# Patient Record
Sex: Male | Born: 1970 | Race: White | Hispanic: No | Marital: Married | Smoking: Never smoker
Health system: Southern US, Community
[De-identification: ages and names within clinical notes are randomized; demographics above are authoritative.]

## PROBLEM LIST (undated history)

## (undated) HISTORY — PX: ROTATOR CUFF REPAIR: SHX139

## (undated) HISTORY — PX: APPENDECTOMY: SHX54

## (undated) HISTORY — PX: TONSILLECTOMY: SUR1361

---

## 2009-10-08 ENCOUNTER — Emergency Department: Payer: Self-pay | Admitting: Emergency Medicine

## 2010-09-17 ENCOUNTER — Emergency Department: Payer: Self-pay | Admitting: Emergency Medicine

## 2012-09-24 ENCOUNTER — Emergency Department (HOSPITAL_BASED_OUTPATIENT_CLINIC_OR_DEPARTMENT_OTHER)
Admission: EM | Admit: 2012-09-24 | Discharge: 2012-09-24 | Disposition: A | Discharge status: Home / Self Care | Payer: Worker's Compensation | Attending: Emergency Medicine | Admitting: Emergency Medicine

## 2012-09-24 ENCOUNTER — Encounter (HOSPITAL_BASED_OUTPATIENT_CLINIC_OR_DEPARTMENT_OTHER): Payer: Self-pay | Admitting: Emergency Medicine

## 2012-09-24 ENCOUNTER — Emergency Department (HOSPITAL_BASED_OUTPATIENT_CLINIC_OR_DEPARTMENT_OTHER): Payer: Worker's Compensation

## 2012-09-24 DIAGNOSIS — W268XXA Contact with other sharp object(s), not elsewhere classified, initial encounter: Secondary | ICD-10-CM | POA: Insufficient documentation

## 2012-09-24 DIAGNOSIS — S61219A Laceration without foreign body of unspecified finger without damage to nail, initial encounter: Secondary | ICD-10-CM

## 2012-09-24 DIAGNOSIS — Y990 Civilian activity done for income or pay: Secondary | ICD-10-CM | POA: Insufficient documentation

## 2012-09-24 DIAGNOSIS — Y9389 Activity, other specified: Secondary | ICD-10-CM | POA: Insufficient documentation

## 2012-09-24 DIAGNOSIS — S61209A Unspecified open wound of unspecified finger without damage to nail, initial encounter: Secondary | ICD-10-CM | POA: Insufficient documentation

## 2012-09-24 DIAGNOSIS — Z880 Allergy status to penicillin: Secondary | ICD-10-CM | POA: Insufficient documentation

## 2012-09-24 DIAGNOSIS — Y9289 Other specified places as the place of occurrence of the external cause: Secondary | ICD-10-CM | POA: Insufficient documentation

## 2012-09-24 MED ORDER — LIDOCAINE HCL 2 % IJ SOLN
INTRAMUSCULAR | Status: AC
  Filled 2012-09-24: qty 20

## 2012-09-24 MED ORDER — TETANUS-DIPHTH-ACELL PERTUSSIS 5-2.5-18.5 LF-MCG/0.5 IM SUSP
0.5000 mL | Freq: Once | INTRAMUSCULAR | Status: AC
  Administered 2012-09-24: 0.5 mL via INTRAMUSCULAR
  Filled 2012-09-24: qty 0.5

## 2012-09-24 MED ORDER — HYDROCODONE-ACETAMINOPHEN 5-325 MG PO TABS: 1.0000 | ORAL_TABLET | ORAL | Status: DC | PRN

## 2012-09-24 NOTE — ED Notes (Signed)
Pt has laceration to left index finger from an engine part

## 2012-09-24 NOTE — ED Provider Notes (Signed)
History     CSN: [id number]  Arrival date & time 09/24/12  4098   First MD Initiated Contact with Patient 09/24/12 1913      Chief Complaint  Patient presents with  . Finger Injury    (Consider location/radiation/quality/duration/timing/severity/associated sxs/prior treatment) HPI Comments: Pt states that he cut his left index finger on a piece of equipment at work:pt states that is not having problems with movement he couldn't get it to stop bleeding:pt states that he doesn't think the tetanus is utd:pt states that the injury helped a short time ago  The history is provided by the patient. No language interpreter was used.    History reviewed. No pertinent past medical history.  Past Surgical History  Procedure Laterality Date  . Rotator cuff repair    . Appendectomy      No family history on file.  History  Substance Use Topics  . Smoking status: Never Smoker   . Smokeless tobacco: Not on file  . Alcohol Use: Yes      Review of Systems  Constitutional: Negative.   Respiratory: Negative.   Cardiovascular: Negative.     Allergies  Penicillins  Home Medications   Current Outpatient Rx  Name  Route  Sig  Dispense  Refill  . Aspirin-Salicylamide-Caffeine (BC HEADACHE POWDER PO)   Oral   Take by mouth as needed.           BP 151/83  Pulse 78  Temp(Src) 98.5 F (36.9 C) (Oral)  Resp 18  Ht 5\' 9"  (1.753 m)  Wt 265 lb (120.203 kg)  BMI 39.12 kg/m2  SpO2 98%  Physical Exam  Nursing note and vitals reviewed. Constitutional: He is oriented to person, place, and time. He appears well-developed and well-nourished.  HENT:  Head: Normocephalic and atraumatic.  Cardiovascular: Normal rate and regular rhythm.   Pulmonary/Chest: Effort normal and breath sounds normal.  Musculoskeletal: Normal range of motion.  Neurological: He is alert and oriented to person, place, and time.  Skin:  Laceration noted over the pip of the dorsal aspect of the left index  finger  Psychiatric: He has a normal mood and affect.    ED Course  LACERATION REPAIR Date/Time: 09/24/2012 8:21 PM Performed by: Teressa Lower Authorized by: Teressa Lower Consent: Verbal consent obtained. Risks and benefits: risks, benefits and alternatives were discussed Patient identity confirmed: verbally with patient Time out: Immediately prior to procedure a "time out" was called to verify the correct patient, procedure, equipment, support staff and site/side marked as required. Body area: upper extremity Location details: left index finger Laceration length: 1.5 cm Foreign bodies: no foreign bodies Anesthesia: digital block Local anesthetic: lidocaine 2% without epinephrine Irrigation solution: saline Irrigation method: syringe Amount of cleaning: extensive Skin closure: 4-0 Prolene Number of sutures: 2 Technique: simple Approximation: close Approximation difficulty: simple Patient tolerance: Patient tolerated the procedure well with no immediate complications.   (including critical care time)  Labs Reviewed - No data to display Dg Finger Index Left  09/24/2012   *RADIOLOGY REPORT*  Clinical Data: Laceration.  LEFT INDEX FINGER 2+V  Comparison: None.  Findings: No acute bony abnormality.  Specifically, no fracture, subluxation, or dislocation.  Soft tissues are intact.  No radiopaque foreign body visualized.  Joint spaces are maintained.  IMPRESSION: No bony abnormality or radiopaque foreign body.   Original Report Authenticated By: Charlett Nose, M.D.     1. Finger laceration, initial encounter       MDM  Wound closed without  any problem:no bony abnormality or fb noted       Teressa Lower, NP 09/24/12 2022

## 2012-09-24 NOTE — ED Notes (Signed)
Patient transported to X-ray 

## 2012-09-24 NOTE — ED Provider Notes (Signed)
Medical screening examination/treatment/procedure(s) were performed by non-physician practitioner and as supervising physician I was immediately available for consultation/collaboration.    Gilda Crease, MD 09/24/12 [phone]

## 2014-04-16 DIAGNOSIS — B9689 Other specified bacterial agents as the cause of diseases classified elsewhere: Secondary | ICD-10-CM | POA: Insufficient documentation

## 2014-04-16 DIAGNOSIS — J019 Acute sinusitis, unspecified: Secondary | ICD-10-CM

## 2014-08-23 ENCOUNTER — Emergency Department: Payer: 59

## 2014-08-23 ENCOUNTER — Other Ambulatory Visit: Payer: Self-pay

## 2014-08-23 ENCOUNTER — Encounter: Payer: Self-pay | Admitting: Emergency Medicine

## 2014-08-23 ENCOUNTER — Emergency Department
Admission: EM | Admit: 2014-08-23 | Discharge: 2014-08-23 | Disposition: A | Discharge status: Home / Self Care | Payer: 59 | Attending: Emergency Medicine | Admitting: Emergency Medicine

## 2014-08-23 DIAGNOSIS — R079 Chest pain, unspecified: Secondary | ICD-10-CM | POA: Insufficient documentation

## 2014-08-23 DIAGNOSIS — Z880 Allergy status to penicillin: Secondary | ICD-10-CM | POA: Diagnosis not present

## 2014-08-23 LAB — BASIC METABOLIC PANEL
Anion gap: 8 (ref 5–15)
BUN: 21 mg/dL — AB (ref 6–20)
CALCIUM: 8.9 mg/dL (ref 8.9–10.3)
CHLORIDE: 107 mmol/L (ref 101–111)
CO2: 23 mmol/L (ref 22–32)
CREATININE: 0.8 mg/dL (ref 0.61–1.24)
GFR calc Af Amer: >60 mL/min (ref >60)
Glucose, Bld: 93 mg/dL (ref 65–99)
Potassium: 4.2 mmol/L (ref 3.5–5.1)
Sodium: 138 mmol/L (ref 135–145)

## 2014-08-23 LAB — CBC
HCT: 45.1 % (ref 40.0–52.0)
Hemoglobin: 14.7 g/dL (ref 13.0–18.0)
MCH: 27.3 pg (ref 26.0–34.0)
MCHC: 32.6 g/dL (ref 32.0–36.0)
MCV: 83.9 fL (ref 80.0–100.0)
Platelets: 179 10*3/uL (ref 150–440)
RBC: 5.38 MIL/uL (ref 4.40–5.90)
RDW: 14.7 % — ABNORMAL HIGH (ref 11.5–14.5)
WBC: 5.9 10*3/uL (ref 3.8–10.6)

## 2014-08-23 LAB — TROPONIN I: Troponin I: <0.03 ng/mL (ref <0.031)

## 2014-08-23 MED ORDER — NITROGLYCERIN 0.4 MG SL SUBL
SUBLINGUAL_TABLET | SUBLINGUAL | Status: AC
  Administered 2014-08-23: 0.4 mg
  Filled 2014-08-23: qty 1

## 2014-08-23 MED ORDER — ASPIRIN 81 MG PO CHEW
CHEWABLE_TABLET | ORAL | Status: AC
  Administered 2014-08-23: 324 mg
  Filled 2014-08-23: qty 4

## 2014-08-23 NOTE — ED Notes (Signed)
Patient presents to ED with complaints of right sided chest pain of acute onset ~4:15 this morning. Reports radiation right lateral. Denies nausea, vomiting or shortness of breath. Reports similar episode 10 days ago; reports resolved spontaneously. Patient in no acute distress. Patient hypertensive during triage; denies hx and does not take BP meds.

## 2014-08-23 NOTE — ED Provider Notes (Signed)
[facility] Emergency Department Provider Note  ____________________________________________  Time seen: 7:30 AM  I have reviewed the triage vital signs and the nursing notes.   HISTORY  Chief Complaint Chest Pain      HPI Whitman HeroChristopher Rachal is a 44 y.o. male chest pressure, which started at approximately 4:30 AM this morning while sleeping. He notes he had an episode like this about 2 weeks ago which resolved on its own. He is not describing radiation to me. The pain is moderate 4 out of 10. He is not short of breath. He reports he has not had a full checkup by a physician ever. He does  have high blood pressure. He also has a strong family history of heart disease but he reports he has never had heart attack. He does not smoke     History reviewed. No pertinent past medical history.  There are no active problems to display for this patient.   Past Surgical History  Procedure Laterality Date  . Rotator cuff repair    . Appendectomy      Current Outpatient Rx  Name  Route  Sig  Dispense  Refill  . Aspirin-Salicylamide-Caffeine (BC HEADACHE POWDER PO)   Oral   Take by mouth as needed.         Marland Kitchen. HYDROcodone-acetaminophen (NORCO/VICODIN) 5-325 MG per tablet   Oral   Take 1 tablet by mouth every 4 (four) hours as needed for pain.   6 tablet   0     Allergies Penicillins  Family History  Problem Relation Age of Onset  . Diabetes Mother   . Heart failure Father   . Diabetes Father     Social History History  Substance Use Topics  . Smoking status: Never Smoker   . Smokeless tobacco: Current User    Types: Chew  . Alcohol Use: Yes     Comment: Social drinker    Review of Systems  Constitutional: Negative for fever. Eyes: Negative for visual changes. ENT: Negative for sore throat. Cardiovascular: Positive for chest pain Respiratory: Negative for shortness of breath. Gastrointestinal: Negative for abdominal pain, vomiting and  diarrhea. Genitourinary: Negative for dysuria. Musculoskeletal: Negative for back pain. Skin: Negative for rash. Neurological: Negative for headaches, focal weakness or numbness. Psychiatric: No anxiety  10-point ROS otherwise negative.  ____________________________________________   PHYSICAL EXAM:  VITAL SIGNS: ED Triage Vitals  Enc Vitals Group     BP 08/23/14 0716 167/101 mmHg     Pulse Rate 08/23/14 0716 67     Resp --      Temp 08/23/14 0716 97.7 F (36.5 C)     Temp Source 08/23/14 0716 Oral     SpO2 08/23/14 0716 98 %     Weight 08/23/14 0716 301 lb 6.4 oz (136.714 kg)     Height 08/23/14 0716 5\' 9"  (1.753 m)     Head Cir --      Peak Flow --      Pain Score 08/23/14 0718 5     Pain Loc --      Pain Edu? --      Excl. in GC? --      Constitutional: Alert and oriented. Well appearing and in no distress. Eyes: Conjunctivae are normal. . Cardiovascular: Normal rate, regular rhythm. Normal and symmetric distal pulses are present in all extremities. No murmurs, rubs, or gallops. Respiratory: Normal respiratory effort without tachypnea nor retractions. Breath sounds are clear and equal bilaterally. No wheezes/rales/rhonchi. Gastrointestinal: Soft  and nontender. No distention. There is no CVA tenderness. Genitourinary: deferred Musculoskeletal: Nontender with normal range of motion in all extremities. No joint effusions.  No lower extremity tenderness nor edema. Neurologic:  Normal speech and language. No gross focal neurologic deficits are appreciated. Speech is normal.  Skin:  Skin is warm, dry and intact. No rash noted. Psychiatric: Mood and affect are normal. Speech and behavior are normal. Patient exhibits appropriate insight and judgment.  ____________________________________________    LABS (pertinent positives/negatives)  Troponin negative 2. Otherwise normal labs  ____________________________________________   EKG   Date: 08/23/2014  Rate: 60   Rhythm: normal sinus rhythm  QRS Axis: normal  Intervals: normal  ST/T Wave abnormalities: normal  Conduction Disutrbances: none  Narrative Interpretation: unremarkable      ____________________________________________    RADIOLOGY  Normal chest x-ray  ____________________________________________   PROCEDURES  Procedure(s) performed:None  Critical Care performed: None  ____________________________________________   INITIAL IMPRESSION / ASSESSMENT AND PLAN / ED COURSE  Pertinent labs & imaging results that were available during my care of the patient were reviewed by me and considered in my medical decision making (see chart for details).  Patient well-appearing, initial EKG is reassuring however we are waiting for blood work ____________________________________________ ----------------------------------------- 11:15 AM on 08/23/2014 -----------------------------------------  Patient chest pain-free. Positive negative 2. EKG reassuring. We will have patient follow-up with cardiology within 48 hours. He agrees.  FINAL CLINICAL IMPRESSION(S) / ED DIAGNOSES  Final diagnoses:  Chest pain, unspecified chest pain type     Jene Everyobert Wadie Mattie, MD 08/23/14 [phone]

## 2014-08-23 NOTE — ED Notes (Signed)
Pt in room with significant other at bedside. Vitals updated.

## 2014-08-23 NOTE — ED Notes (Signed)
Pt informed to return if life threatening symptoms occur.   

## 2014-08-23 NOTE — Discharge Instructions (Signed)

## 2014-08-27 ENCOUNTER — Emergency Department: Payer: 59

## 2014-08-27 ENCOUNTER — Encounter: Payer: Self-pay | Admitting: Emergency Medicine

## 2014-08-27 ENCOUNTER — Emergency Department
Admission: EM | Admit: 2014-08-27 | Discharge: 2014-08-28 | Disposition: A | Discharge status: Home / Self Care | Payer: 59 | Attending: Emergency Medicine | Admitting: Emergency Medicine

## 2014-08-27 DIAGNOSIS — K5732 Diverticulitis of large intestine without perforation or abscess without bleeding: Secondary | ICD-10-CM | POA: Insufficient documentation

## 2014-08-27 DIAGNOSIS — Z880 Allergy status to penicillin: Secondary | ICD-10-CM | POA: Diagnosis not present

## 2014-08-27 DIAGNOSIS — R1031 Right lower quadrant pain: Secondary | ICD-10-CM

## 2014-08-27 DIAGNOSIS — Z7982 Long term (current) use of aspirin: Secondary | ICD-10-CM | POA: Diagnosis not present

## 2014-08-27 LAB — COMPREHENSIVE METABOLIC PANEL
ALT: 21 U/L (ref 17–63)
ANION GAP: 11 (ref 5–15)
AST: 21 U/L (ref 15–41)
Albumin: 4.1 g/dL (ref 3.5–5.0)
Alkaline Phosphatase: 63 U/L (ref 38–126)
BILIRUBIN TOTAL: 0.3 mg/dL (ref 0.3–1.2)
BUN: 26 mg/dL — ABNORMAL HIGH (ref 6–20)
CO2: 21 mmol/L — AB (ref 22–32)
Calcium: 8.9 mg/dL (ref 8.9–10.3)
Chloride: 106 mmol/L (ref 101–111)
Creatinine, Ser: 1.58 mg/dL — ABNORMAL HIGH (ref 0.61–1.24)
GFR calc Af Amer: 59 mL/min — ABNORMAL LOW (ref >60)
GFR calc non Af Amer: 51 mL/min — ABNORMAL LOW (ref >60)
GLUCOSE: 85 mg/dL (ref 65–99)
Potassium: 3.8 mmol/L (ref 3.5–5.1)
SODIUM: 138 mmol/L (ref 135–145)
Total Protein: 7.7 g/dL (ref 6.5–8.1)

## 2014-08-27 LAB — URINALYSIS COMPLETE WITH MICROSCOPIC (ARMC ONLY)
BILIRUBIN URINE: NEGATIVE
Bacteria, UA: NONE SEEN
Glucose, UA: NEGATIVE mg/dL
HGB URINE DIPSTICK: NEGATIVE
Leukocytes, UA: NEGATIVE
Nitrite: NEGATIVE
PH: 5 (ref 5.0–8.0)
Protein, ur: 100 mg/dL — AB
SQUAMOUS EPITHELIAL / LPF: NONE SEEN
Specific Gravity, Urine: 1.028 (ref 1.005–1.030)

## 2014-08-27 LAB — CBC WITH DIFFERENTIAL/PLATELET
BASOS ABS: 0.1 10*3/uL (ref 0–0.1)
BASOS PCT: 1 %
EOS PCT: 1 %
Eosinophils Absolute: 0.1 10*3/uL (ref 0–0.7)
HEMATOCRIT: 40.2 % (ref 40.0–52.0)
HEMOGLOBIN: 13.1 g/dL (ref 13.0–18.0)
Lymphocytes Relative: 17 %
Lymphs Abs: 2 10*3/uL (ref 1.0–3.6)
MCH: 27.2 pg (ref 26.0–34.0)
MCHC: 32.6 g/dL (ref 32.0–36.0)
MCV: 83.5 fL (ref 80.0–100.0)
MONO ABS: 1 10*3/uL (ref 0.2–1.0)
MONOS PCT: 9 %
Neutro Abs: 8.3 10*3/uL — ABNORMAL HIGH (ref 1.4–6.5)
Neutrophils Relative %: 72 %
Platelets: 173 10*3/uL (ref 150–440)
RBC: 4.81 MIL/uL (ref 4.40–5.90)
RDW: 14.6 % — ABNORMAL HIGH (ref 11.5–14.5)
WBC: 11.4 10*3/uL — ABNORMAL HIGH (ref 3.8–10.6)

## 2014-08-27 MED ORDER — IOHEXOL 300 MG/ML  SOLN
100.0000 mL | Freq: Once | INTRAMUSCULAR | Status: AC | PRN
  Administered 2014-08-27: 100 mL via INTRAVENOUS

## 2014-08-27 MED ORDER — ONDANSETRON HCL 4 MG/2ML IJ SOLN: 4.0000 mg | Freq: Once | INTRAMUSCULAR | Status: AC

## 2014-08-27 MED ORDER — MORPHINE SULFATE 4 MG/ML IJ SOLN
INTRAMUSCULAR | Status: AC
  Administered 2014-08-27: 4 mg via INTRAVENOUS
  Filled 2014-08-27: qty 1

## 2014-08-27 MED ORDER — MORPHINE SULFATE 4 MG/ML IJ SOLN: 4.0000 mg | Freq: Once | INTRAMUSCULAR | Status: AC

## 2014-08-27 MED ORDER — IOHEXOL 240 MG/ML SOLN
25.0000 mL | Freq: Once | INTRAMUSCULAR | Status: AC | PRN
  Administered 2014-08-27: 25 mL via ORAL

## 2014-08-27 MED ORDER — ONDANSETRON HCL 4 MG/2ML IJ SOLN
INTRAMUSCULAR | Status: AC
  Administered 2014-08-27: 4 mg via INTRAVENOUS
  Filled 2014-08-27: qty 2

## 2014-08-27 MED ORDER — SODIUM CHLORIDE 0.9 % IV BOLUS (SEPSIS)
1000.0000 mL | Freq: Once | INTRAVENOUS | Status: AC
  Administered 2014-08-27: 1000 mL via INTRAVENOUS

## 2014-08-27 NOTE — ED Notes (Signed)
Pt reports some issues with urination; pt reports "having the feeling of a full bladder, but the amount of urine produced is not consistent the the amount he feels is in his bladder".

## 2014-08-27 NOTE — ED Notes (Signed)
Pt reports abdominal pain and groin pain/swelling since Wednesday. Pt reports he thought "he might have given himself a hernia when lifting a toilet by himself". Pt reports no pain immediately after lifting, but was extremely sore the following day. Pt reports being seen Monday night in the ED for chest pain but is unsure if the symptoms/conditions are related. Pt is A&O, in NAD, mildly uncomfortable d/t pain/discomfort.

## 2014-08-27 NOTE — ED Provider Notes (Signed)
[facility] Emergency Department Provider Note  Time seen: 9:14 PM  I have reviewed the triage vital signs and the nursing notes.   HISTORY  Chief Complaint Abdominal Pain    HPI Lawrence Mercer is a 44 y.o. male presents the emergency department right lower quadrant abdominal pain. According to the patient he was doing some heavy lifting approximately 3 days ago. Patient denies any pain at that time. The next morning he awoke with right lower quadrant pain which has worsened over the last 2 days. Patient states subjective fevers at home but has not measured a temperature. Some nausea, denies vomiting, denies diarrhea or dysuria. Patient had a normal bowel movement today.Patient states he had an appendectomy approximately 20 years ago. Describes abdominal pain as right lower quadrant, moderate in severity worse with movement.     History reviewed. No pertinent past medical history.  There are no active problems to display for this patient.   Past Surgical History  Procedure Laterality Date  . Rotator cuff repair    . Appendectomy      Current Outpatient Rx  Name  Route  Sig  Dispense  Refill  . Aspirin-Salicylamide-Caffeine (BC HEADACHE POWDER PO)   Oral   Take by mouth as needed.         Marland Kitchen. HYDROcodone-acetaminophen (NORCO/VICODIN) 5-325 MG per tablet   Oral   Take 1 tablet by mouth every 4 (four) hours as needed for pain.   6 tablet   0     Allergies Penicillins  Family History  Problem Relation Age of Onset  . Diabetes Mother   . Heart failure Father   . Diabetes Father     Social History History  Substance Use Topics  . Smoking status: Never Smoker   . Smokeless tobacco: Current User    Types: Chew  . Alcohol Use: Yes     Comment: Social drinker    Review of Systems Constitutional: Positive for subjective fevers Cardiovascular: Negative for chest pain. Respiratory: Negative for shortness of breath. Gastrointestinal:  Positive for right lower quadrant abdominal pain, and nausea. Negative for vomiting or diarrhea. Genitourinary: Negative for dysuria. 10-point ROS otherwise negative.  ____________________________________________   PHYSICAL EXAM:  VITAL SIGNS: ED Triage Vitals  Enc Vitals Group     BP 08/27/14 1706 141/87 mmHg     Pulse Rate 08/27/14 1706 71     Resp 08/27/14 1706 20     Temp 08/27/14 1706 98.8 F (37.1 C)     Temp Source 08/27/14 1706 Oral     SpO2 08/27/14 1706 97 %     Weight 08/27/14 1706 295 lb (133.811 kg)     Height 08/27/14 1706 5\' 9"  (1.753 m)     Head Cir --      Peak Flow --      Pain Score 08/27/14 1707 6     Pain Loc --      Pain Edu? --      Excl. in GC? --     Constitutional: Alert and oriented. Well appearing and in no distress. ENT   Mouth/Throat: Mucous membranes are moist. Cardiovascular: Normal rate, regular rhythm. No murmurs Respiratory: Normal respiratory effort without tachypnea nor retractions. Breath sounds are clear and equal bilaterally. No wheezes/rales/rhonchi. Gastrointestinal: Soft, moderate right lower quadrant abdominal tenderness palpation without rebound or guarding. No hernia mass palpated on GU exam, testicles largely nontender to palpation. Musculoskeletal: Nontender with normal range of motion in all extremities.  Neurologic:  Normal speech and language. No gross focal neurologic deficits Skin:  Skin is warm, dry and intact.  Psychiatric: Mood and affect are normal. Speech and behavior are normal.  ____________________________________________      RADIOLOGY  CT abdomen/pelvis pending.  ____________________________________________   INITIAL IMPRESSION / ASSESSMENT AND PLAN / ED COURSE  Pertinent labs & imaging results that were available during my care of the patient were reviewed by me and considered in my medical decision making (see chart for details).  44 year old male with right lower quadrant pain 2-3 days  gradually worsening, with subjective fevers at home. Patient is moderately tender over the right lower quadrant. Patient had an appendectomy 20 years ago however cannot rule out stump appendicitis as patient has mild white blood cell count elevation along with moderate right lower quadrant tenderness palpation. We will proceed with a CT scan to evaluate for stump appendicitis, colitis, or other intra-abdominal pathology. I discussed this with the patient is agreeable to this plan.  ----------------------------------------- 9:21 PM on 08/27/2014 -----------------------------------------  CT abdomen/pelvis pending, pain controlled with morphine, patient care signed out to Dr. Carollee MassedKaminski. ____________________________________________   FINAL CLINICAL IMPRESSION(S) / ED DIAGNOSES  Right lower quadrant abdominal pain   Minna AntisKevin Petrona Wyeth, MD 08/27/14 2121

## 2014-08-27 NOTE — ED Notes (Signed)
Patient to ER for c/o RLQ abdominal pain. Denies any history of similar pain. Reports pain into right testicle as well with "pressure while urinating".

## 2014-08-28 MED ORDER — CIPROFLOXACIN HCL 500 MG PO TABS
ORAL_TABLET | ORAL | Status: AC
  Filled 2014-08-28: qty 1

## 2014-08-28 MED ORDER — OXYCODONE-ACETAMINOPHEN 5-325 MG PO TABS: 1.0000 | ORAL_TABLET | Freq: Four times a day (QID) | ORAL | Status: AC | PRN

## 2014-08-28 MED ORDER — METRONIDAZOLE 500 MG PO TABS
500.0000 mg | ORAL_TABLET | Freq: Once | ORAL | Status: AC
  Administered 2014-08-28: 500 mg via ORAL

## 2014-08-28 MED ORDER — METRONIDAZOLE 500 MG PO TABS: 500.0000 mg | ORAL_TABLET | Freq: Three times a day (TID) | ORAL | Status: AC

## 2014-08-28 MED ORDER — CIPROFLOXACIN HCL 500 MG PO TABS: 500.0000 mg | ORAL_TABLET | Freq: Two times a day (BID) | ORAL | Status: AC

## 2014-08-28 MED ORDER — METRONIDAZOLE 500 MG PO TABS
ORAL_TABLET | ORAL | Status: AC
  Filled 2014-08-28: qty 1

## 2014-08-28 MED ORDER — CIPROFLOXACIN HCL 500 MG PO TABS
500.0000 mg | ORAL_TABLET | Freq: Once | ORAL | Status: AC
  Administered 2014-08-28: 500 mg via ORAL

## 2014-08-28 NOTE — ED Provider Notes (Signed)
Patient seen and checked out by Dr. Lenard LancePaduchowski, patient comfortable lying in bed in no acute distress at this time. CT scan reveals diverticulitis without perforation or rupture. He is given dose of Cipro and Flagyl be stable for outpatient follow-up.  Impression:  Diverticulitis   Plan: Patient continues Cipro and Flagyl, Percocet as needed. Follow-up in the ER in 2-3 days if not improving. He does have a primary care doctor, so advised to return here if his symptoms aren't any better in a few days  Emily FilbertJonathan E Mikhail Hallenbeck, MD 08/28/14 [phone]

## 2014-08-28 NOTE — Discharge Instructions (Signed)

## 2015-04-23 ENCOUNTER — Encounter: Payer: Self-pay | Admitting: Medical Oncology

## 2015-04-23 ENCOUNTER — Emergency Department
Admission: EM | Admit: 2015-04-23 | Discharge: 2015-04-23 | Disposition: A | Discharge status: Home / Self Care | Payer: 59 | Attending: Emergency Medicine | Admitting: Emergency Medicine

## 2015-04-23 ENCOUNTER — Emergency Department: Payer: 59

## 2015-04-23 DIAGNOSIS — K8020 Calculus of gallbladder without cholecystitis without obstruction: Secondary | ICD-10-CM | POA: Diagnosis not present

## 2015-04-23 DIAGNOSIS — Z880 Allergy status to penicillin: Secondary | ICD-10-CM | POA: Diagnosis not present

## 2015-04-23 DIAGNOSIS — K8050 Calculus of bile duct without cholangitis or cholecystitis without obstruction: Secondary | ICD-10-CM

## 2015-04-23 DIAGNOSIS — R1011 Right upper quadrant pain: Secondary | ICD-10-CM | POA: Diagnosis present

## 2015-04-23 LAB — URINALYSIS COMPLETE WITH MICROSCOPIC (ARMC ONLY)
BILIRUBIN URINE: NEGATIVE
Bacteria, UA: NONE SEEN
Glucose, UA: NEGATIVE mg/dL
Hgb urine dipstick: NEGATIVE
Leukocytes, UA: NEGATIVE
Nitrite: NEGATIVE
Protein, ur: 30 mg/dL — AB
RBC / HPF: NONE SEEN RBC/hpf (ref 0–5)
Specific Gravity, Urine: 1.017 (ref 1.005–1.030)
pH: 5 (ref 5.0–8.0)

## 2015-04-23 LAB — COMPREHENSIVE METABOLIC PANEL
ALT: 38 U/L (ref 17–63)
AST: 32 U/L (ref 15–41)
Albumin: 4.4 g/dL (ref 3.5–5.0)
Alkaline Phosphatase: 86 U/L (ref 38–126)
Anion gap: 8 (ref 5–15)
BUN: 16 mg/dL (ref 6–20)
CO2: 25 mmol/L (ref 22–32)
Calcium: 9.8 mg/dL (ref 8.9–10.3)
Chloride: 104 mmol/L (ref 101–111)
Creatinine, Ser: 0.73 mg/dL (ref 0.61–1.24)
GFR calc Af Amer: >60 mL/min (ref >60)
GFR calc non Af Amer: >60 mL/min (ref >60)
Glucose, Bld: 99 mg/dL (ref 65–99)
Potassium: 3.8 mmol/L (ref 3.5–5.1)
Sodium: 137 mmol/L (ref 135–145)
Total Bilirubin: 1.1 mg/dL (ref 0.3–1.2)
Total Protein: 7.9 g/dL (ref 6.5–8.1)

## 2015-04-23 LAB — CBC
HCT: 46 % (ref 40.0–52.0)
Hemoglobin: 15.2 g/dL (ref 13.0–18.0)
MCH: 26.3 pg (ref 26.0–34.0)
MCHC: 33.1 g/dL (ref 32.0–36.0)
MCV: 79.4 fL — ABNORMAL LOW (ref 80.0–100.0)
PLATELETS: 195 10*3/uL (ref 150–440)
RBC: 5.79 MIL/uL (ref 4.40–5.90)
RDW: 14.3 % (ref 11.5–14.5)
WBC: 7.5 10*3/uL (ref 3.8–10.6)

## 2015-04-23 LAB — LIPASE, BLOOD: Lipase: 28 U/L (ref 11–51)

## 2015-04-23 MED ORDER — FAMOTIDINE 20 MG PO TABS: 20.0000 mg | ORAL_TABLET | Freq: Every day | ORAL | Status: DC

## 2015-04-23 MED ORDER — OXYCODONE-ACETAMINOPHEN 5-325 MG PO TABS: 2.0000 | ORAL_TABLET | Freq: Four times a day (QID) | ORAL | Status: DC | PRN

## 2015-04-23 NOTE — ED Notes (Signed)
Pt reports rt upper quad abd pain that began last night, pt reports nausea and vomiting also.

## 2015-04-23 NOTE — ED Provider Notes (Signed)
[facility] Emergency Department Provider Note     Time seen: ----------------------------------------- 2:48 PM on 04/23/2015 -----------------------------------------    I have reviewed the triage vital signs and the nursing notes.   HISTORY  Chief Complaint Abdominal Pain    HPI Lawrence Mercer is a 45 y.o. male reports right upper quadrant abdominal pain that began last night. He reports nausea vomiting also. Standing helps his symptoms, sitting and laying back makes it worse. He's never had this happen before, denies any fevers chills or other complaints.   History reviewed. No pertinent past medical history.  There are no active problems to display for this patient.   Past Surgical History  Procedure Laterality Date  . Rotator cuff repair    . Appendectomy      Allergies Penicillins  Social History Social History  Substance Use Topics  . Smoking status: Never Smoker   . Smokeless tobacco: Current User    Types: Chew  . Alcohol Use: Yes     Comment: Social drinker    Review of Systems Constitutional: Negative for fever. Eyes: Negative for visual changes. ENT: Negative for sore throat. Cardiovascular: Negative for chest pain. Respiratory: Negative for shortness of breath. Gastrointestinal: Positive for abdominal pain and vomiting Genitourinary: Negative for dysuria. Musculoskeletal: Negative for back pain. Skin: Negative for rash. Neurological: Negative for headaches, focal weakness or numbness.  10-point ROS otherwise negative.  ____________________________________________   PHYSICAL EXAM:  VITAL SIGNS: ED Triage Vitals  Enc Vitals Group     BP 04/23/15 1245 145/96 mmHg     Pulse Rate 04/23/15 1245 99     Resp 04/23/15 1245 19     Temp 04/23/15 1245 97.8 F (36.6 C)     Temp Source 04/23/15 1245 Oral     SpO2 04/23/15 1245 98 %     Weight 04/23/15 1245 280 lb (127.007 kg)     Height 04/23/15 1245 5\' 9"  (1.753  m)     Head Cir --      Peak Flow --      Pain Score 04/23/15 1246 10     Pain Loc --      Pain Edu? --      Excl. in GC? --     Constitutional: Alert and oriented. Well appearing and in no distress. Eyes: Conjunctivae are normal. PERRL. Normal extraocular movements. ENT   Head: Normocephalic and atraumatic.   Nose: No congestion/rhinnorhea.   Mouth/Throat: Mucous membranes are moist.   Neck: No stridor. Cardiovascular: Normal rate, regular rhythm. Normal and symmetric distal pulses are present in all extremities. No murmurs, rubs, or gallops. Respiratory: Normal respiratory effort without tachypnea nor retractions. Breath sounds are clear and equal bilaterally. No wheezes/rales/rhonchi. Gastrointestinal: Right upper quadrant pain, no rebound or guarding. Normal bowel sounds. Musculoskeletal: Nontender with normal range of motion in all extremities. No joint effusions.  No lower extremity tenderness nor edema. Neurologic:  Normal speech and language. No gross focal neurologic deficits are appreciated. Speech is normal. No gait instability. Skin:  Skin is warm, dry and intact. No rash noted. Psychiatric: Mood and affect are normal. Speech and behavior are normal. Patient exhibits appropriate insight and judgment. ____________________________________________  EKG: Interpreted by me. Normal sinus rhythm with a rate of 95 bpm, normal PR interval, normal QRS, normal QT interval. Normal EKG  ____________________________________________  ED COURSE:  Pertinent labs & imaging results that were available during my care of the patient were reviewed by me and considered in my medical  decision making (see chart for details). Patient is no acute distress, will check basic labs a quadrant ultrasound ____________________________________________    LABS (pertinent positives/negatives)  Labs Reviewed  CBC - Abnormal; Notable for the following:    MCV 79.4 (*)    All other  components within normal limits  URINALYSIS COMPLETEWITH MICROSCOPIC ([facility] ONLY) - Abnormal; Notable for the following:    Color, Urine YELLOW (*)    APPearance CLEAR (*)    Ketones, ur TRACE (*)    Protein, ur 30 (*)    Squamous Epithelial / LPF 0-5 (*)    All other components within normal limits  LIPASE, BLOOD  COMPREHENSIVE METABOLIC PANEL    RADIOLOGY Images were viewed by me  Right upper quadrant ultrasound IMPRESSION: Mild gallbladder sludge. No findings to suggest acute cholecystitis.  Mild diffuse hepatic steatosis. ____________________________________________  FINAL ASSESSMENT AND PLAN  Abdominal pain, biliary colic  Plan: Patient with labs and imaging as dictated above. Patient with either a poorly functioning gallbladder or biliary colic. He does have gallbladder sludge and is tender in the right upper quadrant only. His labs are otherwise normal. He'll be referred to general surgery for follow-up as an outpatient.   Emily FilbertWilliams, Jonathan E, MD   Emily FilbertJonathan E Williams, MD 04/23/15 [phone]

## 2015-04-23 NOTE — Discharge Instructions (Signed)
Biliary Colic °Biliary colic is a pain in the upper abdomen. The pain: °· Is usually felt on the right side of the abdomen, but it may also be felt in the center of the abdomen, just below the breastbone (sternum). °· May spread back toward the right shoulder blade. °· May be steady or irregular. °· May be accompanied by nausea and vomiting. °Most of the time, the pain goes away in 1-5 hours. After the most intense pain passes, the abdomen may continue to ache mildly for about 24 hours. °Biliary colic is caused by a blockage in the bile duct. The bile duct is a pathway that carries bile--a liquid that helps to digest fats--from the gallbladder to the small intestine. Biliary colic usually occurs after eating, when the digestive system demands bile. The pain develops when muscle cells contract forcefully to try to move the blockage so that bile can get by. °HOME CARE INSTRUCTIONS °· Take medicines only as directed by your health care provider. °· Drink enough fluid to keep your urine clear or pale yellow. °· Avoid fatty, greasy, and fried foods. These kinds of foods increase your body's demand for bile. °· Avoid any foods that make your pain worse. °· Avoid overeating. °· Avoid having a large meal after fasting. °SEEK MEDICAL CARE IF: °· You develop a fever. °· Your pain gets worse. °· You vomit. °· You develop nausea that prevents you from eating and drinking. °SEEK IMMEDIATE MEDICAL CARE IF: °· You suddenly develop a fever and shaking chills. °· You develop a yellowish discoloration (jaundice) of: °¨ Skin. °¨ Whites of the eyes. °¨ Mucous membranes. °· You have continuous or severe pain that is not relieved with medicines. °· You have nausea and vomiting that is not relieved with medicines. °· You develop dizziness or you faint. °  °This information is not intended to replace advice given to you by your health care provider. Make sure you discuss any questions you have with your health care provider. °  °Document  Released: 08/27/2005 Document Revised: 08/10/2014 Document Reviewed: 01/05/2014 °Elsevier Interactive Patient Education ©2016 [employer] ° °

## 2015-04-25 ENCOUNTER — Telehealth: Payer: Self-pay | Admitting: Surgery

## 2015-04-25 NOTE — Telephone Encounter (Signed)
Patient was recently seen in Emergency room, Abd pain and still in a lot of pain, Per Emergency room notes patient has biliary colic. Patient was told to follow up with Dr.Cooper. I do not see any openings for patient for a few weeks please call patient if you can find a work in slot.

## 2015-04-25 NOTE — Telephone Encounter (Signed)
Returned phone call to patient at this time. He explains that since he was seen in the ER and was given pain medication, the pain has been more tolerable. All pain is in RUQ Abdomen and does not radiate. Has a pain of 3/10 at all times and then increases after eating to sometimes as high as a 10/10 but pain medications makes this better. Able to hold down food and water ok and is eating a bland diet at this time to help with pain. He denies jaundice, nausea, vomiting. Does state that he has been having some constipation and then diarrhea but nothing makes this occur. He feels like he may have a fever but when he takes it, it is normal.   Patient placed on schedule to see surgeon this week. Explained to patient that if pain gets worse or he runs out of his pain medications he should go to Emergency Room as we have a surgeon on call 24/7 if needed after seen by ER Physician. Patient verbalizes understanding of this information.

## 2015-04-26 ENCOUNTER — Other Ambulatory Visit: Payer: Self-pay

## 2015-04-28 ENCOUNTER — Observation Stay
Admission: EM | Admit: 2015-04-28 | Discharge: 2015-04-30 | Disposition: A | Discharge status: Home / Self Care | Payer: 59 | Attending: General Surgery | Admitting: General Surgery

## 2015-04-28 ENCOUNTER — Emergency Department: Payer: 59

## 2015-04-28 ENCOUNTER — Encounter
Admission: EM | Disposition: A | Discharge status: Home / Self Care | Payer: Self-pay | Source: Home / Self Care | Attending: Emergency Medicine

## 2015-04-28 ENCOUNTER — Observation Stay: Payer: 59 | Admitting: Anesthesiology

## 2015-04-28 ENCOUNTER — Encounter: Payer: Self-pay | Admitting: Emergency Medicine

## 2015-04-28 DIAGNOSIS — E669 Obesity, unspecified: Secondary | ICD-10-CM | POA: Diagnosis not present

## 2015-04-28 DIAGNOSIS — Z880 Allergy status to penicillin: Secondary | ICD-10-CM | POA: Diagnosis not present

## 2015-04-28 DIAGNOSIS — Z833 Family history of diabetes mellitus: Secondary | ICD-10-CM | POA: Insufficient documentation

## 2015-04-28 DIAGNOSIS — J329 Chronic sinusitis, unspecified: Secondary | ICD-10-CM | POA: Diagnosis not present

## 2015-04-28 DIAGNOSIS — R197 Diarrhea, unspecified: Secondary | ICD-10-CM | POA: Diagnosis not present

## 2015-04-28 DIAGNOSIS — R112 Nausea with vomiting, unspecified: Secondary | ICD-10-CM | POA: Diagnosis not present

## 2015-04-28 DIAGNOSIS — R1011 Right upper quadrant pain: Secondary | ICD-10-CM | POA: Diagnosis present

## 2015-04-28 DIAGNOSIS — K810 Acute cholecystitis: Secondary | ICD-10-CM | POA: Diagnosis not present

## 2015-04-28 DIAGNOSIS — Z6841 Body Mass Index (BMI) 40.0 and over, adult: Secondary | ICD-10-CM | POA: Diagnosis not present

## 2015-04-28 DIAGNOSIS — R1013 Epigastric pain: Secondary | ICD-10-CM | POA: Diagnosis not present

## 2015-04-28 DIAGNOSIS — K811 Chronic cholecystitis: Secondary | ICD-10-CM | POA: Insufficient documentation

## 2015-04-28 DIAGNOSIS — Z79899 Other long term (current) drug therapy: Secondary | ICD-10-CM | POA: Diagnosis not present

## 2015-04-28 DIAGNOSIS — Z8249 Family history of ischemic heart disease and other diseases of the circulatory system: Secondary | ICD-10-CM | POA: Diagnosis not present

## 2015-04-28 DIAGNOSIS — K819 Cholecystitis, unspecified: Secondary | ICD-10-CM | POA: Diagnosis present

## 2015-04-28 HISTORY — PX: CHOLECYSTECTOMY: SHX55

## 2015-04-28 LAB — COMPREHENSIVE METABOLIC PANEL
ALBUMIN: 4.1 g/dL (ref 3.5–5.0)
ALT: 40 U/L (ref 17–63)
AST: 27 U/L (ref 15–41)
Alkaline Phosphatase: 87 U/L (ref 38–126)
Anion gap: 7 (ref 5–15)
BUN: 17 mg/dL (ref 6–20)
CO2: 24 mmol/L (ref 22–32)
Calcium: 9.4 mg/dL (ref 8.9–10.3)
Chloride: 105 mmol/L (ref 101–111)
Creatinine, Ser: 0.72 mg/dL (ref 0.61–1.24)
GFR calc Af Amer: >60 mL/min (ref >60)
GFR calc non Af Amer: >60 mL/min (ref >60)
GLUCOSE: 106 mg/dL — AB (ref 65–99)
Potassium: 3.9 mmol/L (ref 3.5–5.1)
SODIUM: 136 mmol/L (ref 135–145)
Total Bilirubin: 0.9 mg/dL (ref 0.3–1.2)
Total Protein: 7.2 g/dL (ref 6.5–8.1)

## 2015-04-28 LAB — CBC WITH DIFFERENTIAL/PLATELET
BASOS PCT: 1 %
Basophils Absolute: 0.1 10*3/uL (ref 0–0.1)
EOS PCT: 2 %
Eosinophils Absolute: 0.2 10*3/uL (ref 0–0.7)
HCT: 41.8 % (ref 40.0–52.0)
HEMOGLOBIN: 14 g/dL (ref 13.0–18.0)
LYMPHS ABS: 2.1 10*3/uL (ref 1.0–3.6)
Lymphocytes Relative: 29 %
MCH: 26.8 pg (ref 26.0–34.0)
MCHC: 33.5 g/dL (ref 32.0–36.0)
MCV: 80 fL (ref 80.0–100.0)
MONOS PCT: 9 %
Monocytes Absolute: 0.7 10*3/uL (ref 0.2–1.0)
Neutro Abs: 4.3 10*3/uL (ref 1.4–6.5)
Neutrophils Relative %: 59 %
PLATELETS: 194 10*3/uL (ref 150–440)
RBC: 5.22 MIL/uL (ref 4.40–5.90)
RDW: 14.1 % (ref 11.5–14.5)
WBC: 7.3 10*3/uL (ref 3.8–10.6)

## 2015-04-28 LAB — LIPASE, BLOOD: Lipase: 40 U/L (ref 11–51)

## 2015-04-28 SURGERY — LAPAROSCOPIC CHOLECYSTECTOMY
Anesthesia: General | Wound class: Clean Contaminated

## 2015-04-28 MED ORDER — PROPOFOL 10 MG/ML IV BOLUS
INTRAVENOUS | Status: DC | PRN
  Administered 2015-04-28: 200 mg via INTRAVENOUS

## 2015-04-28 MED ORDER — LIDOCAINE HCL 2 % EX GEL
CUTANEOUS | Status: DC | PRN
  Administered 2015-04-28: 1 via TOPICAL

## 2015-04-28 MED ORDER — ONDANSETRON HCL 4 MG/2ML IJ SOLN
INTRAMUSCULAR | Status: AC
  Administered 2015-04-28: 4 mg via INTRAVENOUS
  Filled 2015-04-28: qty 2

## 2015-04-28 MED ORDER — MIDAZOLAM HCL 2 MG/2ML IJ SOLN
INTRAMUSCULAR | Status: DC | PRN
  Administered 2015-04-28: 2 mg via INTRAVENOUS

## 2015-04-28 MED ORDER — LACTATED RINGERS IV SOLN
INTRAVENOUS | Status: DC
  Administered 2015-04-28 – 2015-04-30 (×4): via INTRAVENOUS

## 2015-04-28 MED ORDER — ONDANSETRON HCL 4 MG/2ML IJ SOLN: 4.0000 mg | Freq: Four times a day (QID) | INTRAMUSCULAR | Status: DC | PRN

## 2015-04-28 MED ORDER — ACETAMINOPHEN 10 MG/ML IV SOLN
INTRAVENOUS | Status: AC
  Filled 2015-04-28: qty 100

## 2015-04-28 MED ORDER — SUCCINYLCHOLINE CHLORIDE 20 MG/ML IJ SOLN
INTRAMUSCULAR | Status: DC | PRN
  Administered 2015-04-28: 120 mg via INTRAVENOUS

## 2015-04-28 MED ORDER — MORPHINE SULFATE (PF) 4 MG/ML IV SOLN
4.0000 mg | INTRAVENOUS | Status: DC | PRN
  Administered 2015-04-28: 4 mg via INTRAVENOUS
  Filled 2015-04-28: qty 1

## 2015-04-28 MED ORDER — KETOROLAC TROMETHAMINE 30 MG/ML IJ SOLN
INTRAMUSCULAR | Status: DC | PRN
  Administered 2015-04-28: 30 mg via INTRAVENOUS

## 2015-04-28 MED ORDER — BUPIVACAINE-EPINEPHRINE (PF) 0.25% -1:200000 IJ SOLN
INTRAMUSCULAR | Status: DC | PRN
  Administered 2015-04-28: 30 mL

## 2015-04-28 MED ORDER — BUPIVACAINE-EPINEPHRINE (PF) 0.25% -1:200000 IJ SOLN
INTRAMUSCULAR | Status: AC
  Filled 2015-04-28: qty 30

## 2015-04-28 MED ORDER — CIPROFLOXACIN IN D5W 400 MG/200ML IV SOLN
400.0000 mg | Freq: Two times a day (BID) | INTRAVENOUS | Status: DC
  Administered 2015-04-28 – 2015-04-30 (×4): 400 mg via INTRAVENOUS
  Filled 2015-04-28 (×7): qty 200

## 2015-04-28 MED ORDER — GLYCOPYRROLATE 0.2 MG/ML IJ SOLN
INTRAMUSCULAR | Status: DC | PRN
  Administered 2015-04-28 (×2): 0.2 mg via INTRAVENOUS

## 2015-04-28 MED ORDER — HEPARIN SODIUM (PORCINE) 5000 UNIT/ML IJ SOLN
5000.0000 [IU] | Freq: Three times a day (TID) | INTRAMUSCULAR | Status: DC
  Administered 2015-04-28 – 2015-04-30 (×4): 5000 [IU] via SUBCUTANEOUS
  Filled 2015-04-28 (×4): qty 1

## 2015-04-28 MED ORDER — ONDANSETRON HCL 4 MG/2ML IJ SOLN: 4.0000 mg | Freq: Once | INTRAMUSCULAR | Status: DC | PRN

## 2015-04-28 MED ORDER — ACETAMINOPHEN 10 MG/ML IV SOLN
INTRAVENOUS | Status: DC | PRN
  Administered 2015-04-28: 1000 mg via INTRAVENOUS

## 2015-04-28 MED ORDER — HYDRALAZINE HCL 20 MG/ML IJ SOLN: 10.0000 mg | INTRAMUSCULAR | Status: DC | PRN

## 2015-04-28 MED ORDER — FENTANYL CITRATE (PF) 100 MCG/2ML IJ SOLN
INTRAMUSCULAR | Status: DC | PRN
  Administered 2015-04-28: 100 ug via INTRAVENOUS
  Administered 2015-04-28: 50 ug via INTRAVENOUS

## 2015-04-28 MED ORDER — DIPHENHYDRAMINE HCL 50 MG/ML IJ SOLN: 25.0000 mg | Freq: Four times a day (QID) | INTRAMUSCULAR | Status: DC | PRN

## 2015-04-28 MED ORDER — SUGAMMADEX SODIUM 500 MG/5ML IV SOLN
INTRAVENOUS | Status: DC | PRN
  Administered 2015-04-28: 300 mg via INTRAVENOUS

## 2015-04-28 MED ORDER — LIDOCAINE HCL (CARDIAC) 20 MG/ML IV SOLN
INTRAVENOUS | Status: DC | PRN
  Administered 2015-04-28: 120 mg via INTRAVENOUS

## 2015-04-28 MED ORDER — MORPHINE SULFATE (PF) 4 MG/ML IV SOLN
4.0000 mg | INTRAVENOUS | Status: DC | PRN
  Administered 2015-04-29 (×2): 4 mg via INTRAVENOUS
  Filled 2015-04-28 (×2): qty 1

## 2015-04-28 MED ORDER — FENTANYL CITRATE (PF) 100 MCG/2ML IJ SOLN
25.0000 ug | INTRAMUSCULAR | Status: DC | PRN
  Administered 2015-04-28 (×4): 25 ug via INTRAVENOUS

## 2015-04-28 MED ORDER — DIPHENHYDRAMINE HCL 25 MG PO CAPS: 25.0000 mg | ORAL_CAPSULE | Freq: Four times a day (QID) | ORAL | Status: DC | PRN

## 2015-04-28 MED ORDER — ONDANSETRON HCL 4 MG/2ML IJ SOLN
4.0000 mg | Freq: Once | INTRAMUSCULAR | Status: AC
  Administered 2015-04-28: 4 mg via INTRAVENOUS

## 2015-04-28 MED ORDER — ONDANSETRON 4 MG PO TBDP: 4.0000 mg | ORAL_TABLET | Freq: Four times a day (QID) | ORAL | Status: DC | PRN

## 2015-04-28 MED ORDER — OXYCODONE-ACETAMINOPHEN 5-325 MG PO TABS
1.0000 | ORAL_TABLET | ORAL | Status: DC | PRN
  Administered 2015-04-29: 1 via ORAL
  Filled 2015-04-28: qty 1

## 2015-04-28 MED ORDER — DEXAMETHASONE SODIUM PHOSPHATE 10 MG/ML IJ SOLN
INTRAMUSCULAR | Status: DC | PRN
  Administered 2015-04-28: 10 mg via INTRAVENOUS

## 2015-04-28 MED ORDER — ROCURONIUM BROMIDE 100 MG/10ML IV SOLN
INTRAVENOUS | Status: DC | PRN
  Administered 2015-04-28: 30 mg via INTRAVENOUS

## 2015-04-28 MED ORDER — PANTOPRAZOLE SODIUM 40 MG IV SOLR
40.0000 mg | Freq: Every day | INTRAVENOUS | Status: DC
  Administered 2015-04-28: 40 mg via INTRAVENOUS
  Filled 2015-04-28: qty 40

## 2015-04-28 MED ORDER — FENTANYL CITRATE (PF) 100 MCG/2ML IJ SOLN
INTRAMUSCULAR | Status: AC
  Administered 2015-04-28: 25 ug via INTRAVENOUS
  Filled 2015-04-28: qty 2

## 2015-04-28 MED ORDER — ONDANSETRON HCL 4 MG/2ML IJ SOLN
INTRAMUSCULAR | Status: DC | PRN
  Administered 2015-04-28: 4 mg via INTRAVENOUS

## 2015-04-28 MED ORDER — MORPHINE SULFATE (PF) 4 MG/ML IV SOLN
4.0000 mg | Freq: Once | INTRAVENOUS | Status: AC
  Administered 2015-04-28: 4 mg via INTRAVENOUS
  Filled 2015-04-28: qty 1

## 2015-04-28 SURGICAL SUPPLY — 47 items
ADHESIVE MASTISOL STRL (MISCELLANEOUS) ×3 IMPLANT
APPLIER CLIP ROT 10 11.4 M/L (STAPLE) ×3
BLADE SURG SZ11 CARB STEEL (BLADE) ×3 IMPLANT
CANISTER SUCT 1200ML W/VALVE (MISCELLANEOUS) ×3 IMPLANT
CATH CHOLANGI 4FR 420404F (CATHETERS) IMPLANT
CHLORAPREP W/TINT 26ML (MISCELLANEOUS) ×3 IMPLANT
CLIP APPLIE ROT 10 11.4 M/L (STAPLE) ×1 IMPLANT
CLOSURE WOUND 1/2 X4 (GAUZE/BANDAGES/DRESSINGS) ×1
CONRAY 60ML FOR OR (MISCELLANEOUS) IMPLANT
DRAPE C-ARM XRAY 36X54 (DRAPES) IMPLANT
ELECT REM PT RETURN 9FT ADLT (ELECTROSURGICAL) ×3
ELECTRODE REM PT RTRN 9FT ADLT (ELECTROSURGICAL) ×1 IMPLANT
ENDOPOUCH RETRIEVER 10 (MISCELLANEOUS) ×3 IMPLANT
GAUZE SPONGE NON-WVN 2X2 STRL (MISCELLANEOUS) ×4 IMPLANT
GLOVE BIO SURGEON STRL SZ8 (GLOVE) ×9 IMPLANT
GLOVE BIOGEL PI IND STRL 6.5 (GLOVE) ×1 IMPLANT
GLOVE BIOGEL PI INDICATOR 6.5 (GLOVE) ×2
GLOVE EXAM NITRILE PF MED BLUE (GLOVE) ×3 IMPLANT
GOWN STRL REUS W/ TWL LRG LVL3 (GOWN DISPOSABLE) ×4 IMPLANT
GOWN STRL REUS W/TWL LRG LVL3 (GOWN DISPOSABLE) ×8
IRRIGATION STRYKERFLOW (MISCELLANEOUS) ×1 IMPLANT
IRRIGATOR STRYKERFLOW (MISCELLANEOUS) ×3
IV CATH ANGIO 12GX3 LT BLUE (NEEDLE) IMPLANT
IV NS 1000ML (IV SOLUTION) ×2
IV NS 1000ML BAXH (IV SOLUTION) ×1 IMPLANT
JACKSON PRATT 10 (INSTRUMENTS) IMPLANT
KIT RM TURNOVER STRD PROC AR (KITS) ×3 IMPLANT
LABEL OR SOLS (LABEL) ×3 IMPLANT
NDL SAFETY 22GX1.5 (NEEDLE) ×3 IMPLANT
NEEDLE VERESS 14GA 120MM (NEEDLE) ×3 IMPLANT
NS IRRIG 500ML POUR BTL (IV SOLUTION) ×3 IMPLANT
PACK LAP CHOLECYSTECTOMY (MISCELLANEOUS) ×3 IMPLANT
SCISSORS METZENBAUM CVD 33 (INSTRUMENTS) ×3 IMPLANT
SLEEVE ENDOPATH XCEL 5M (ENDOMECHANICALS) ×3 IMPLANT
SPONGE EXCIL AMD DRAIN 4X4 6P (MISCELLANEOUS) IMPLANT
SPONGE LAP 18X18 5 PK (GAUZE/BANDAGES/DRESSINGS) ×3 IMPLANT
SPONGE VERSALON 2X2 STRL (MISCELLANEOUS) ×8
STRIP CLOSURE SKIN 1/2X4 (GAUZE/BANDAGES/DRESSINGS) ×2 IMPLANT
SUT ETHILON 4 0 P 3 18 (SUTURE) ×3 IMPLANT
SUT MNCRL 4-0 (SUTURE) ×4
SUT MNCRL 4-0 27XMFL (SUTURE) ×2
SUT VICRYL 0 AB UR-6 (SUTURE) ×3 IMPLANT
SUTURE MNCRL 4-0 27XMF (SUTURE) ×2 IMPLANT
SYR 20CC LL (SYRINGE) ×3 IMPLANT
TROCAR XCEL NON-BLD 11X100MML (ENDOMECHANICALS) ×3 IMPLANT
TROCAR XCEL NON-BLD 5MMX100MML (ENDOMECHANICALS) ×6 IMPLANT
TUBING INSUFFLATOR HI FLOW (MISCELLANEOUS) ×3 IMPLANT

## 2015-04-28 NOTE — H&P (Signed)
Patient ID: Lawrence Mercer, male   DOB: 1970/10/07, 45 y.o.   MRN: [id number]  CC: Abdominal pain  HPI Lawrence Mercer is a 45 y.o. male who presents to emergency department for recurrent, worsening abdominal pain. Patient states that for the last several months he's been having intermittent right upper quadrant and midepigastric abdominal pain after he eats. He initially thought this was related to gastritis or an ulcer demonstrating this with antacids without any relief. Over the last week he has had multiple attacks of severe right upper quadrant abdominal pain with associated nausea, vomiting, diarrhea. He was seen in the emergency department for this 5 days ago and was diagnosed with biliary colic and discharged home with pain medication and outpatient follow-up. Patient states that although he did well for the first couple days after that visit over the last 24 hours she's had a recurrence of his symptoms with severe right upper quadrant abdominal pain, nausea, vomiting and diarrhea. He's had subjective fevers and chills but no objective fevers or chills. Patient states that that he has had food triggers for his pain to include fried or spicy foods. He denies any chest pain or shortness of breath and is otherwise in his usual state of health.  HPI  History reviewed. No pertinent past medical history.  Past Surgical History  Procedure Laterality Date  . Rotator cuff repair    . Appendectomy    . Rotator cuff repair      (both)  . Tonsillectomy      Family History  Problem Relation Age of Onset  . Diabetes Mother   . Heart failure Father   . Diabetes Father     Social History Social History  Substance Use Topics  . Smoking status: Never Smoker   . Smokeless tobacco: Current User    Types: Chew  . Alcohol Use: No     Comment: Social drinker    Allergies  Allergen Reactions  . Penicillins Other (See Comments)    Caused "water blisters" Has patient had a PCN reaction  causing immediate rash, facial/tongue/throat swelling, SOB or lightheadedness with hypotension: No Has patient had a PCN reaction causing severe rash involving mucus membranes or skin necrosis: No Has patient had a PCN reaction that required hospitalization No Has patient had a PCN reaction occurring within the last 10 years: No If all of the above answers are "NO", then may proceed with Cephalosporin use.    No current facility-administered medications for this encounter.   Current Outpatient Prescriptions  Medication Sig Dispense Refill  . famotidine (PEPCID) 20 MG tablet Take 1 tablet (20 mg total) by mouth daily. 30 tablet 1  . oxyCODONE-acetaminophen (PERCOCET) 5-325 MG tablet Take 2 tablets by mouth every 6 (six) hours as needed for moderate pain or severe pain. 30 tablet 0  . HYDROcodone-acetaminophen (NORCO/VICODIN) 5-325 MG per tablet Take 1 tablet by mouth every 4 (four) hours as needed for pain. (Patient not taking: Reported on 04/28/2015) 6 tablet 0  . oxyCODONE-acetaminophen (ROXICET) 5-325 MG per tablet Take 1 tablet by mouth every 6 (six) hours as needed. (Patient not taking: Reported on 04/28/2015) 20 tablet 0     Review of Systems A multi-point review of systems was asked and was negative except for the findings documented in the history of present illness  Physical Exam Blood pressure 139/83, pulse 67, temperature 97.8 F (36.6 C), temperature source Oral, resp. rate 18, height  (1.753 m), weight 127.007 kg (280 lb), SpO2 96 %.  CONSTITUTIONAL: No acute distress. EYES: Pupils are equal, round, and reactive to light, Sclera are non-icteric. EARS, NOSE, MOUTH AND THROAT: The oropharynx is clear. The oral mucosa is pink and moist. Hearing is intact to voice. LYMPH NODES:  Lymph nodes in the neck are normal. RESPIRATORY:  Lungs are clear. There is normal respiratory effort, with equal breath sounds bilaterally, and without pathologic use of accessory  muscles. CARDIOVASCULAR: Heart is regular without murmurs, gallops, or rubs. GI: The abdomen is large, soft,tender to palpation in the right upper quadrant with a positive Murphy sign, and nondistended. There are no palpable masses. There is no hepatosplenomegaly. There are normal bowel sounds in all quadrants. GU: Rectal deferred.   MUSCULOSKELETAL: Normal muscle strength and tone. No cyanosis or edema.   SKIN: Turgor is good and there are no pathologic skin lesions or ulcers. NEUROLOGIC: Motor and sensation is grossly normal. Cranial nerves are grossly intact. PSYCH:  Oriented to person, place and time. Affect is normal.  Data Reviewed I personally reviewed the labs and images. Labs from today are primarily within normal limits including normal bilirubin and normal transaminases. Images were not repeated today as they were done 4 days ago show cholelithiasis and sludge within the gallbladder.  I have personally reviewed the patient's imaging, laboratory findings and medical records.    Assessment    Cholecystitis     Plan    45 year old male with a history and physical exam consistent with acute cholecystitis. Discussed with the patient in detail the diagnosis of cholecystitis and the surgery to remove the gallbladder being performed laparoscopically. Patient voiced understanding and wishes to be done with this process as soon as possible. Plan to admit observation for a laparoscopic cholecystectomy later today with either Dr. Excell Seltzer on herself.      Time spent with the patient was 50 minutes, with more than 50% of the time spent in face-to-face education, counseling and care coordination.     Ricarda Frame, MD FACS General Surgeon 04/28/2015, 6:39 AM

## 2015-04-28 NOTE — Progress Notes (Signed)
I discussed this patient's history both with the patient and family as well as with Dr. Tonita Cong and I've reviews his history in the chart.  We discussed the rationale for surgery the options of observation risk bleeding infection recurrence failure to resolve his symptoms and the risk of conversion to an open procedure bile duct damage bile duct leak retained common bile duct stone any of which could require further surgery and/or ERCP stent papillotomy this is all reviewed for he and the presence of his family member understood and agreed to proceed

## 2015-04-28 NOTE — Anesthesia Procedure Notes (Signed)
Procedure Name: Intubation Date/Time: 04/28/2015 3:19 PM Performed by: Stormy Fabian Pre-anesthesia Checklist: Patient identified, Emergency Drugs available, Suction available and Patient being monitored Patient Re-evaluated:Patient Re-evaluated prior to inductionOxygen Delivery Method: Circle system utilized Preoxygenation: Pre-oxygenation with 100% oxygen Intubation Type: IV induction, Cricoid Pressure applied and Rapid sequence Ventilation: Mask ventilation without difficulty Laryngoscope Size: Mac and 4 Grade View: Grade III Tube type: Oral Tube size: 7.5 mm Number of attempts: 1 Airway Equipment and Method: Stylet Placement Confirmation: ETT inserted through vocal cords under direct vision,  positive ETCO2 and breath sounds checked- equal and bilateral Secured at: 21 cm Tube secured with: Tape Dental Injury: Teeth and Oropharynx as per pre-operative assessment  Comments: Grade 3 view with anterior airway

## 2015-04-28 NOTE — Op Note (Signed)
Laparoscopic Cholecystectomy  Pre-operative Diagnosis: Acute cholecystitis  Post-operative Diagnosis: Acute cholecystitis  Procedure: Lap or scopic cholecystectomy  Surgeon: Adah Salvage. Excell Seltzer, MD FACS  Anesthesia: Gen. with endotracheal tube  Assistant: PA student  Procedure Details  The patient was seen again in the Holding Room. The benefits, complications, treatment options, and expected outcomes were discussed with the patient. The risks of bleeding, infection, recurrence of symptoms, failure to resolve symptoms, bile duct damage, bile duct leak, retained common bile duct stone, bowel injury, any of which could require further surgery and/or ERCP, stent, or papillotomy were reviewed with the patient. The likelihood of improving the patient's symptoms with return to their baseline status is good.  The patient and/or family concurred with the proposed plan, giving informed consent.  The patient was taken to Operating Room, identified as Lawrence Mercer and the procedure verified as Laparoscopic Cholecystectomy.  A Time Out was held and the above information confirmed.  Prior to the induction of general anesthesia, antibiotic prophylaxis was administered. VTE prophylaxis was in place. General endotracheal anesthesia was then administered and tolerated well. After the induction, the abdomen was prepped with Chloraprep and draped in the sterile fashion. The patient was positioned in the supine position.  Local anesthetic  was injected into the skin near the umbilicus and an incision made. The Veress needle was placed. Pneumoperitoneum was then created with CO2 and tolerated well without any adverse changes in the patient's vital signs. A 5mm port was placed in the periumbilical position and the abdominal cavity was explored.  Two 5-mm ports were placed in the right upper quadrant and a 12 mm epigastric port was placed all under direct vision. All skin incisions  were infiltrated with a local  anesthetic agent before making the incision and placing the trocars.   The patient was positioned  in reverse Trendelenburg, tilted slightly to the patient's left.  The gallbladder was identified, the fundus grasped and retracted cephalad. Adhesions were lysed bluntly. The infundibulum was grasped and retracted laterally, exposing the peritoneum overlying the triangle of Calot. This was then divided and exposed in a blunt fashion. A critical view of the cystic duct and cystic artery was obtained.  The cystic duct was clearly identified and bluntly dissected.   2 branches of cystic artery was doubly clipped and divided this allowed for good visualization of the cystic duct as it entered the infundibulum. It was doubly clipped and divided.  The gallbladder was taken from the gallbladder fossa in a retrograde fashion with the electrocautery. The gallbladder was removed and placed in an Endocatch bag. The liver bed was irrigated and inspected. Hemostasis was achieved with the electrocautery. Copious irrigation was utilized and was repeatedly aspirated until clear.  The gallbladder and Endocatch sac were then removed through the epigastric port site.   Hemostasis was adequate and a 10 mm JP drain was brought in through a lateral port site and placed into the foramen of Winslow and held in with 3-0 nylon.  Inspection of the right upper quadrant was performed. No bleeding, bile duct injury or leak, or bowel injury was noted. Pneumoperitoneum was released.  The epigastric port site was closed with figure-of-eight 0 Vicryl sutures. 4-0 subcuticular Monocryl was used to close the skin. Steristrips and Mastisol and sterile dressings were  applied.  The patient was then extubated and brought to the recovery room in stable condition. Sponge, lap, and needle counts were correct at closure and at the conclusion of the case.   Findings:  Acute Cholecystitis   Estimated Blood Loss: 25         Drains: JP 1          Specimens: Gallbladder           Complications: none               Lawrence Mercer E. Excell Seltzer, MD, FACS

## 2015-04-28 NOTE — ED Provider Notes (Signed)
[facility] Emergency Department Provider Note  ____________________________________________  Time seen: 4:40 AM  I have reviewed the triage vital signs and the nursing notes.   HISTORY  Chief Complaint Abdominal Pain      HPI Lawrence Mercer is a 45 y.o. male presents with worsening right upper quadrant pain currently 10 out of 10 accompanied by nausea and vomiting. Of note patient was seen in the emergency department on 04/23/2015 and diagnosed with gallbladder disease. Patient has an appointment with Dr. Excell Seltzer general surgeon tomorrow for cholecystectomy. Patient's that he return to the emergency department tonight because of worsening pain unrelieved with Percocet at home.     History reviewed. No pertinent past medical history.  Patient Active Problem List   Diagnosis Date Noted  . Acute bacterial sinusitis 04/16/2014    Past Surgical History  Procedure Laterality Date  . Rotator cuff repair    . Appendectomy    . Rotator cuff repair      (both)  . Tonsillectomy      Current Outpatient Rx  Name  Route  Sig  Dispense  Refill  . famotidine (PEPCID) 20 MG tablet   Oral   Take 1 tablet (20 mg total) by mouth daily.   30 tablet   1   . oxyCODONE-acetaminophen (PERCOCET) 5-325 MG tablet   Oral   Take 2 tablets by mouth every 6 (six) hours as needed for moderate pain or severe pain.   30 tablet   0   . HYDROcodone-acetaminophen (NORCO/VICODIN) 5-325 MG per tablet   Oral   Take 1 tablet by mouth every 4 (four) hours as needed for pain. Patient not taking: Reported on 04/28/2015   6 tablet   0   . oxyCODONE-acetaminophen (ROXICET) 5-325 MG per tablet   Oral   Take 1 tablet by mouth every 6 (six) hours as needed. Patient not taking: Reported on 04/28/2015   20 tablet   0     Allergies Penicillins  Family History  Problem Relation Age of Onset  . Diabetes Mother   . Heart failure Father   . Diabetes Father     Social  History Social History  Substance Use Topics  . Smoking status: Never Smoker   . Smokeless tobacco: Current User    Types: Chew  . Alcohol Use: No     Comment: Social drinker    Review of Systems  Constitutional: Negative for fever. Eyes: Negative for visual changes. ENT: Negative for sore throat. Cardiovascular: Negative for chest pain. Respiratory: Negative for shortness of breath. Gastrointestinal: Positive for abdominal pain and vomiting Genitourinary: Negative for dysuria. Musculoskeletal: Negative for back pain. Skin: Negative for rash. Neurological: Negative for headaches, focal weakness or numbness.  10-point ROS otherwise negative.  ____________________________________________   PHYSICAL EXAM:  VITAL SIGNS: ED Triage Vitals  Enc Vitals Group     BP 04/28/15 0204 143/81 mmHg     Pulse Rate 04/28/15 0204 63     Resp 04/28/15 0204 18     Temp 04/28/15 0204 97.8 F (36.6 C)     Temp Source 04/28/15 0204 Oral     SpO2 04/28/15 0204 95 %     Weight 04/28/15 0204 280 lb (127.007 kg)     Height 04/28/15 0204  (1.753 m)     Head Cir --      Peak Flow --      Pain Score 04/28/15 0205 10     Pain Loc --  Pain Edu? --      Excl. in GC? --      Constitutional: Alert and oriented. Well appearing and in no distress. Eyes: Conjunctivae are normal. PERRL. Normal extraocular movements. ENT   Head: Normocephalic and atraumatic.   Nose: No congestion/rhinnorhea.   Mouth/Throat: Mucous membranes are moist.   Neck: No stridor. Hematological/Lymphatic/Immunilogical: No cervical lymphadenopathy. Cardiovascular: Normal rate, regular rhythm. Normal and symmetric distal pulses are present in all extremities. No murmurs, rubs, or gallops. Respiratory: Normal respiratory effort without tachypnea nor retractions. Breath sounds are clear and equal bilaterally. No wheezes/rales/rhonchi. Gastrointestinal: Right upper quadrant tenderness to palpation No  distention. There is no CVA tenderness. Genitourinary: deferred Musculoskeletal: Nontender with normal range of motion in all extremities. No joint effusions.  No lower extremity tenderness nor edema. Neurologic:  Normal speech and language. No gross focal neurologic deficits are appreciated. Speech is normal.  Skin:  Skin is warm, dry and intact. No rash noted. Psychiatric: Mood and affect are normal. Speech and behavior are normal. Patient exhibits appropriate insight and judgment.  ____________________________________________    LABS (pertinent positives/negatives)  Labs Reviewed  COMPREHENSIVE METABOLIC PANEL - Abnormal; Notable for the following:    Glucose, Bld 106 (*)    All other components within normal limits  CBC WITH DIFFERENTIAL/PLATELET  LIPASE, BLOOD        INITIAL IMPRESSION / ASSESSMENT AND PLAN / ED COURSE  Pertinent labs & imaging results that were available during my care of the patient were reviewed by me and considered in my medical decision making (see chart for details).  History & physical exam consistent acute cholecystitis as such patient was discussed with Dr. Tonita Cong general surgeon on call. Plan to admit the patient for cholecystectomy.  ____________________________________________   FINAL CLINICAL IMPRESSION(S) / ED DIAGNOSES  Final diagnoses:  Acute cholecystitis      [name], MD 04/28/15 [phone]

## 2015-04-28 NOTE — Anesthesia Preprocedure Evaluation (Signed)
Anesthesia Evaluation  Patient identified by MRN, date of birth, ID band Patient awake    Reviewed: Allergy & Precautions, NPO status , Patient's Chart, lab work & pertinent test results  Airway Mallampati: III  TM Distance: >3 FB Neck ROM: Full    Dental  (+) Chipped   Pulmonary neg pulmonary ROS,    Pulmonary exam normal breath sounds clear to auscultation       Cardiovascular negative cardio ROS Normal cardiovascular exam     Neuro/Psych negative neurological ROS  negative psych ROS   GI/Hepatic Neg liver ROS, GERD  Medicated and Controlled,cholecystitis   Endo/Other  negative endocrine ROS  Renal/GU negative Renal ROS  negative genitourinary   Musculoskeletal negative musculoskeletal ROS (+)   Abdominal Normal abdominal exam  (+) + obese,   Peds negative pediatric ROS (+)  Hematology negative hematology ROS (+)   Anesthesia Other Findings obese  Reproductive/Obstetrics                             Anesthesia Physical Anesthesia Plan  ASA: II  Anesthesia Plan: General   Post-op Pain Management:    Induction: Intravenous, Rapid sequence and Cricoid pressure planned  Airway Management Planned: Oral ETT  Additional Equipment:   Intra-op Plan:   Post-operative Plan: Extubation in OR  Informed Consent: I have reviewed the patients History and Physical, chart, labs and discussed the procedure including the risks, benefits and alternatives for the proposed anesthesia with the patient or authorized representative who has indicated his/her understanding and acceptance.   Dental advisory given  Plan Discussed with: CRNA and Surgeon  Anesthesia Plan Comments:         Anesthesia Quick Evaluation

## 2015-04-28 NOTE — Anesthesia Postprocedure Evaluation (Signed)
Anesthesia Post Note  Patient: Quatavious Rossa  Procedure(s) Performed: Procedure(s) (LRB): LAPAROSCOPIC CHOLECYSTECTOMY (N/A)  Patient location during evaluation: PACU Anesthesia Type: General Level of consciousness: awake and alert Pain management: pain level controlled Vital Signs Assessment: post-procedure vital signs reviewed and stable Respiratory status: spontaneous breathing, nonlabored ventilation, respiratory function stable and patient connected to nasal cannula oxygen Cardiovascular status: blood pressure returned to baseline and stable Postop Assessment: no signs of nausea or vomiting Anesthetic complications: no    Last Vitals:  Filed Vitals:   04/28/15 1745 04/28/15 1837  BP: 124/81 126/73  Pulse: 92 85  Temp:  36.5 C  Resp: 16 17    Last Pain:  Filed Vitals:   04/28/15 1837  PainSc: 0-No pain                 Lenard Simmer

## 2015-04-28 NOTE — ED Notes (Signed)
Pt states he was seen over the weekend for gallbladder pain. Pt was told he needed to have his gallbladder taken out and was referred to a surgeon, also sent home with pain medication. Pt reports to ED today with uncontrollable pain to the right upper quadrant of abdomen. Pt is scheduled to see surgeon on Friday for consult.

## 2015-04-28 NOTE — Transfer of Care (Signed)
Immediate Anesthesia Transfer of Care Note  Patient: Lawrence Mercer  Procedure(s) Performed: Procedure(s): LAPAROSCOPIC CHOLECYSTECTOMY (N/A)  Patient Location: PACU  Anesthesia Type:General  Level of Consciousness: sedated  Airway & Oxygen Therapy: Patient Spontanous Breathing and Patient connected to face mask oxygen  Post-op Assessment: Report given to RN and Post -op Vital signs reviewed and stable  Post vital signs: Reviewed and stable  Last Vitals:  Filed Vitals:   04/28/15 1312 04/28/15 1619  BP: 135/83 149/99  Pulse: 53 97  Temp: 35.7 C 36.4 C  Resp: 18 6    Complications: No apparent anesthesia complications

## 2015-04-28 NOTE — ED Notes (Signed)
Pt presents to ED with sharp RUQ abd pain. Seen in this ED Saturday for the same and was told it was his gallbladder and has an appt Friday to schedule surgery. Was told if symptoms worsen before then to come back to ED. Pt states pain worsened around 2200 and has not gotten any relief from his prescribed pain medications from his previous visit. Vomited 2-3X. abd is tender to touch.

## 2015-04-29 ENCOUNTER — Encounter: Payer: Self-pay | Admitting: Surgery

## 2015-04-29 MED ORDER — OXYCODONE-ACETAMINOPHEN 5-325 MG PO TABS
2.0000 | ORAL_TABLET | ORAL | Status: DC | PRN
  Administered 2015-04-29: 2 via ORAL
  Filled 2015-04-29: qty 2

## 2015-04-29 MED ORDER — PANTOPRAZOLE SODIUM 40 MG PO TBEC
40.0000 mg | DELAYED_RELEASE_TABLET | Freq: Two times a day (BID) | ORAL | Status: DC
  Administered 2015-04-29 (×2): 40 mg via ORAL
  Filled 2015-04-29 (×2): qty 1

## 2015-04-29 MED ORDER — OXYCODONE-ACETAMINOPHEN 5-325 MG PO TABS: 1.0000 | ORAL_TABLET | ORAL | Status: DC | PRN

## 2015-04-29 MED ORDER — KETOROLAC TROMETHAMINE 30 MG/ML IJ SOLN
30.0000 mg | Freq: Four times a day (QID) | INTRAMUSCULAR | Status: DC
  Administered 2015-04-30 (×2): 30 mg via INTRAVENOUS
  Filled 2015-04-29 (×2): qty 1

## 2015-04-29 MED ORDER — DIPHENHYDRAMINE HCL 25 MG PO CAPS
25.0000 mg | ORAL_CAPSULE | Freq: Once | ORAL | Status: AC
Start: 2015-04-29 — End: 2015-04-29
  Administered 2015-04-29: 25 mg via ORAL
  Filled 2015-04-29: qty 1

## 2015-04-29 MED ORDER — MORPHINE SULFATE (PF) 4 MG/ML IV SOLN: 4.0000 mg | INTRAVENOUS | Status: DC | PRN

## 2015-04-29 NOTE — Progress Notes (Signed)
Doing well postsurgery. He has no nausea vomiting or fever. He is tolerating liquid diet. He has moderate bloody drainage with no evidence of any bile in the Jackson-Pratt drain. He has been up voiding spontaneously. We will decrease his IV fluids and advance his diet

## 2015-04-30 MED ORDER — METRONIDAZOLE 500 MG PO TABS: 500.0000 mg | ORAL_TABLET | Freq: Three times a day (TID) | ORAL | Status: DC

## 2015-04-30 MED ORDER — CIPROFLOXACIN HCL 500 MG PO TABS: 500.0000 mg | ORAL_TABLET | Freq: Two times a day (BID) | ORAL | Status: DC

## 2015-04-30 MED ORDER — OXYCODONE-ACETAMINOPHEN 5-325 MG PO TABS: 2.0000 | ORAL_TABLET | ORAL | Status: DC | PRN

## 2015-04-30 NOTE — Progress Notes (Signed)
Pt alert and oriented. IV site removed. Concerns addressed. Discharge summary and prescriptions given to pt.

## 2015-04-30 NOTE — Discharge Instructions (Signed)
Laparoscopic Cholecystectomy, Care After   These instructions give you information on caring for yourself after your procedure. Your doctor may also give you more specific instructions. Call your doctor if you have any problems or questions after your procedure.  HOME CARE  Change your bandages (dressings) as told by your doctor.  Keep the wound dry and clean. Wash the wound gently with soap and water. Pat the wound dry with a clean towel.  Do not take baths, swim, or use hot tubs for 2 weeks, or as told by your doctor.  Only take medicine as told by your doctor.  Eat a normal diet as told by your doctor.  Do not lift anything heavier than 10 pounds (4.5 kg) until your doctor says it is okay.  Do not play contact sports for 1 week, or as told by your doctor. GET HELP IF:  Your wound is red, puffy (swollen), or painful.  You have yellowish-white fluid (pus) coming from the wound.  You have fluid draining from the wound for more than 1 day.  You have a bad smell coming from the wound.  Your wound breaks open. GET HELP RIGHT AWAY IF:  You have trouble breathing.  You have chest pain.  You have a fever >101  You have pain in the shoulders (shoulder strap areas) that is getting worse.  You feel dizzy or pass out (faint).  You have severe belly (abdominal) pain.  You feel sick to your stomach (nauseous) or throw up (vomit) for more than 1 day.   Bulb Drain Home Care A bulb drain consists of a thin rubber tube and a soft, round bulb that creates a gentle suction. The rubber tube is placed in the area where you had surgery. A bulb is attached to the end of the tube that is outside the body. The bulb drain removes excess fluid that normally builds up in a surgical wound after surgery. The color and amount of fluid will vary. Immediately after surgery, the fluid is bright red and is a little thicker than water. It may gradually change to a yellow or pink color and become more thin and water-like.  When the amount decreases to about 1 or 2 tbsp in 24 hours, your health care provider will usually remove it. DAILY CARE  Keep the bulb flat (compressed) at all times, except while emptying it. The flatness creates suction. You can flatten the bulb by squeezing it firmly in the middle and then closing the cap.  Keep sites where the tube enters the skin dry and covered with a bandage (dressing).  Secure the tube 1-2 in (2.5-5.1 cm) below the insertion sites to keep it from pulling on your stitches. The tube is stitched in place and will not slip out.  Secure the bulb as directed by your health care provider.  For the first 3 days after surgery, there usually is more fluid in the bulb. Empty the bulb whenever it becomes half full because the bulb does not create enough suction if it is too full. The bulb could also overflow. Write down how much fluid you remove each time you empty your drain. Add up the amount removed in 24 hours.  Empty the bulb at the same time every day once the amount of fluid decreases and you only need to empty it once a day. Write down the amounts and the 24-hour totals to give to your health care provider. This helps your health care provider know when the  tubes can be removed. EMPTYING THE BULB DRAIN Before emptying the bulb, get a measuring cup, a piece of paper and a pen, and wash your hands.  Gently run your fingers down the tube (stripping) to empty any drainage from the tubing into the bulb. This may need to be done several times a day to clear the tubing of clots and tissue.  Open the bulb cap to release suction, which causes it to inflate. Do not touch the inside of the cap.  Gently run your fingers down the tube (stripping) to empty any drainage from the tubing into the bulb.  Hold the cap out of the way, and pour fluid into the measuring cup.   Squeeze the bulb to provide suction.  Replace the cap.   Check the tape that holds the tube to your skin. If  it is becoming loose, you can remove the loose piece of tape and apply a new one. Then, pin the bulb to your shirt.   Write down the amount of fluid you emptied out. Write down the date and each time you emptied your bulb drain. (If there are 2 bulbs, note the amount of drainage from each bulb and keep the totals separate. Your health care provider will want to know the total amounts for each drain and which tube is draining more.)   Flush the fluid down the toilet and wash your hands.   Call your health care provider once you have less than 2 tbsp of fluid collecting in the bulb drain every 24 hours. If there is drainage around the tube site, change dressings and keep the area dry. Cleanse around tube with sterile saline and place dry gauze around site. This gauze should be changed when it is soiled. If it stays clean and unsoiled, it should still be changed daily.  SEEK MEDICAL CARE IF:  Your drainage has a bad smell or is cloudy.   You have a fever.   Your drainage is increasing instead of decreasing.   Your tube fell out.   You have redness or swelling around the tube site.   You have drainage from a surgical wound.   Your bulb drain will not stay flat after you empty it.  MAKE SURE YOU:   Understand these instructions.  Will watch your condition.  Will get help right away if you are not doing well or get worse.   This information is not intended to replace advice given to you by your health care provider. Make sure you discuss any questions you have with your health care provider.   Document Released: 03/23/2000 Document Revised: 04/16/2014 Document Reviewed: 10/13/2014 Elsevier Interactive Patient Education [employer]

## 2015-04-30 NOTE — Discharge Summary (Signed)
Patient ID: Lawrence Mercer MRN: [id number] DOB/AGE: [birth year] 45 y.o.  Admit date: 04/28/2015 Discharge date: 04/30/2015  Discharge Diagnoses:  Cholecystitis  Procedures Performed: Laparoscopic cholecystectomy  Discharged Condition: good  Hospital Course: Patient admitted from the emergency department with acute cholecystitis. Taken to the operating room for laparoscopic cholecystectomy to confirm diagnosis. Patient did well in the postoperative course. Able be discharged home with a JP drain in place, on a course of oral antibiotics, tolerating regular diet.  Discharge Orders:  discharge home. Continue to charge and record drain output.  Disposition: 01-Home or Self Care  Discharge Medications:  .  oxyCODONE-acetaminophen (PERCOCET/ROXICET) 5-325 MG per tablet 2 tablet, 2 tablet, Oral, Q4H PRN, Gladis Riffle, MD, 2 tablet at 04/29/15 2207 - cipro  -1 tab by mouth BID - Flagyl  - 1 tab by mouth TID  Follwup: Follow-up Information    Follow up with [facility]. Schedule an appointment as soon as possible for a visit in 1 week.   Why:  For suture removal, For wound re-check   Contact information:   [address] [place] [address] [phone]      Signed: Ricarda Frame 04/30/2015, 10:46 AM

## 2015-04-30 NOTE — Progress Notes (Signed)
Pt alert. Tolerating regular diet. Reported bowel movement yesterday. Ambulates. Scant drainage noted in JP drain <15 emptied this am. Pt looking forward to possible JP drain removal and discharge to home.

## 2015-04-30 NOTE — Final Progress Note (Signed)
2 Days Post-Op   Subjective:  Patient did well overnight. Tolerating regular diet and desires to go home.  Vital signs in last 24 hours: Temp:  [97.4 F (36.3 C)-98.3 F (36.8 C)] 97.4 F (36.3 C) (01/21 0443) Pulse Rate:  [64-77] 64 (01/21 0443) Resp:  [16-20] 16 (01/21 0443) BP: (114-137)/(61-70) 114/70 mmHg (01/21 0443) SpO2:  [96 %-100 %] 100 % (01/21 0443) Last BM Date: 04/29/15  Intake/Output from previous day: 01/20 0701 - 01/21 0700 In: 2210.3 [I.V.:2210.3] Out: 1605 [Urine:1550; Drains:55]  GI: Abdomen soft, nontender, nondistended. Well approximated laparoscopic cholecystectomy sites. JP drain in place draining serosanguineous fluid. No evidence of peritonitis or spreading erythema.  Lab Results:  CBC  Recent Labs  04/28/15 0213  WBC 7.3  HGB 14.0  HCT 41.8  PLT 194   CMP     Component Value Date/Time   NA 136 04/28/2015 0213   K 3.9 04/28/2015 0213   CL 105 04/28/2015 0213   CO2 24 04/28/2015 0213   GLUCOSE 106* 04/28/2015 0213   BUN 17 04/28/2015 0213   CREATININE 0.72 04/28/2015 0213   CALCIUM 9.4 04/28/2015 0213   PROT 7.2 04/28/2015 0213   ALBUMIN 4.1 04/28/2015 0213   AST 27 04/28/2015 0213   ALT 40 04/28/2015 0213   ALKPHOS 87 04/28/2015 0213   BILITOT 0.9 04/28/2015 0213   GFRNONAA >60 04/28/2015 0213   GFRAA >60 04/28/2015 0213   PT/INR No results for input(s): LABPROT, INR in the last 72 hours.  Studies/Results: No results found.  Assessment/Plan: 45 year old male status post laparoscopic cholestatic cystectomy for acute cholecystitis. Doing well. Will discharge home with JP drain, oral antibiotics, oral pain medications. He'll follow up in clinic next week for drain removal.   Ricarda Frame, MD [facility] General Surgeon  04/30/2015

## 2015-05-02 ENCOUNTER — Telehealth: Payer: Self-pay

## 2015-05-02 LAB — SURGICAL PATHOLOGY

## 2015-05-02 NOTE — Telephone Encounter (Signed)
Around 10-11ml currently every 8 hours, bloody drainage. Denies any redness or swelling around the drain site or any incision. Vomiting 1-2 hours stomach bile, denies nausea. Unable to keep any foods down, can hold down water. Diarrhea as well but has started right after starting antibiotics. Hot and cold chills. Continues taking antibiotics and Ranitidine as prescribed.   Call made to Dr. Tonita Cong at this time. He will return phone call as soon as he is done with current case.

## 2015-05-02 NOTE — Telephone Encounter (Signed)
Patient was discharged over the weekend. He called in to make follow-up appointment.

## 2015-05-02 NOTE — Telephone Encounter (Addendum)
Spoke with Dr. Tonita Cong at this time. He feels like this is not associated with prior symptoms or surgery at this time. He would like patient to continue with 1/31 appointment with Dr. Excell Seltzer but if he gets to the point that he cannot hold water down or if he develops a fever, he needs to come to the Emergency Room. But asked me to call and make sure patient is doing ok tomorrow.  Returned phone call to patient. Explained the above per Dr. Tonita Cong. Patient feels like this is just a virus as well but just wanted to make sure he is ok. Patient verbalizes understanding of conversation.  I will call and check on patient tomorrow to ensure that he is getting better with symptoms.

## 2015-05-03 NOTE — Telephone Encounter (Signed)
Patient states that he is feeling much better this morning. Was able to hold down oatmeal and toast this morning without difficulty. Drinking Gatorade at this time to help with electrolyte replacement. Encouraged patient to continue with antibiotics and with electrolyte replacement until he is feeling back to himself.  Encouraged patient to call back with any further questions or concerns.

## 2015-05-05 ENCOUNTER — Telehealth: Payer: Self-pay | Admitting: Surgery

## 2015-05-05 NOTE — Telephone Encounter (Signed)
Please call patient. He had Lap Chole with Dr Excell Seltzer on 1/19. He has a jp drain tube in his stomach and has run out of the adhesive applicators that go on his stomach around the tube as well as dressings.  He cannot find any anywhere and his follow up appointment isn't until next week. Please call and advise where he can get these or if he can stop by the office and we give him sone.

## 2015-05-05 NOTE — Telephone Encounter (Signed)
Returned phone call to patient at this time. He was given a drain sponge and tegaderm to place on JP by nursing staff at hospital and has ran out. I informed the patient that he can cut a 4x4 and place this around drain and tape a 4x4 over this area instead of using those other dressings. He verbalizes understanding. Patient is still having significant drainage from JP drain at this time. He will call back prior to appointment if this stops completely so that we can work him in to pull drain.

## 2015-05-10 ENCOUNTER — Ambulatory Visit (INDEPENDENT_AMBULATORY_CARE_PROVIDER_SITE_OTHER): Payer: 59 | Admitting: Surgery

## 2015-05-10 ENCOUNTER — Encounter: Payer: Self-pay | Admitting: Surgery

## 2015-05-10 ENCOUNTER — Telehealth: Payer: Self-pay

## 2015-05-10 VITALS — BP 158/100 | HR 89 | Temp 98.0°F | Ht 69.0 in | Wt 295.0 lb

## 2015-05-10 DIAGNOSIS — K810 Acute cholecystitis: Secondary | ICD-10-CM

## 2015-05-10 NOTE — Patient Instructions (Signed)
Please give [place] a call if you have any questions or concerns. No heavy lifting more than 15 lb for another two weeks.

## 2015-05-10 NOTE — Progress Notes (Signed)
Outpatient postop visit  05/10/2015  Marquavius Scaife is an 45 y.o. male.    Procedure:  Laparoscopic cholecystectomy  CC: improved  HPI:  [place] patient status post laparoscopic cholecystectomy for acute cholecystitis he has a drain in place is doing much better overall.  he still has some minimal pain in the right upper quadrant but his bowel movements are normal and is eating well. His job requires heavy lifting however.  Medications reviewed.    Physical Exam:  BP 158/100 mmHg  Pulse 89  Temp(Src) 98 F (36.7 C) (Oral)  Ht  (1.753 m)  Wt 295 lb (133.811 kg)  BMI 43.54 kg/m2    PE:  Drain in place  no bile in the drain drain is removed no erythema no drainage otherwise soft nontender abdomen no jaundice   Assessment/Plan:   pathology is reviewed.  Drain is been removed patient is doing very well recommend follow up on an as-needed basis he cannot do any heavy lifting for another 2 weeks.   Lattie Haw, MD, FACS

## 2015-05-10 NOTE — Telephone Encounter (Signed)
Patient gave me his FMLA Form to fill out. I filled it out while he was in clinic. I also gave him a disability certificate to turn in. I made a copy to scan in his chart.

## 2019-11-04 ENCOUNTER — Other Ambulatory Visit: Payer: Self-pay

## 2019-11-04 ENCOUNTER — Encounter: Payer: Self-pay | Admitting: Emergency Medicine

## 2019-11-04 ENCOUNTER — Emergency Department
Admission: EM | Admit: 2019-11-04 | Discharge: 2019-11-04 | Disposition: A | Discharge status: Home / Self Care | Payer: 59 | Attending: Emergency Medicine | Admitting: Emergency Medicine

## 2019-11-04 DIAGNOSIS — R5383 Other fatigue: Secondary | ICD-10-CM | POA: Insufficient documentation

## 2019-11-04 DIAGNOSIS — Z5321 Procedure and treatment not carried out due to patient leaving prior to being seen by health care provider: Secondary | ICD-10-CM | POA: Insufficient documentation

## 2019-11-04 DIAGNOSIS — R34 Anuria and oliguria: Secondary | ICD-10-CM | POA: Insufficient documentation

## 2019-11-04 LAB — BASIC METABOLIC PANEL
Anion gap: 13 (ref 5–15)
BUN: 28 mg/dL — ABNORMAL HIGH (ref 6–20)
CO2: 23 mmol/L (ref 22–32)
Calcium: 8.9 mg/dL (ref 8.9–10.3)
Chloride: 104 mmol/L (ref 98–111)
Creatinine, Ser: 1.07 mg/dL (ref 0.61–1.24)
GFR calc Af Amer: >60 mL/min (ref >60)
GFR calc non Af Amer: >60 mL/min (ref >60)
Glucose, Bld: 117 mg/dL — ABNORMAL HIGH (ref 70–99)
Potassium: 3.6 mmol/L (ref 3.5–5.1)
Sodium: 140 mmol/L (ref 135–145)

## 2019-11-04 LAB — CBC
HCT: 43.2 % (ref 39.0–52.0)
Hemoglobin: 14 g/dL (ref 13.0–17.0)
MCH: 27.5 pg (ref 26.0–34.0)
MCHC: 32.4 g/dL (ref 30.0–36.0)
MCV: 84.7 fL (ref 80.0–100.0)
Platelets: 160 10*3/uL (ref 150–400)
RBC: 5.1 MIL/uL (ref 4.22–5.81)
RDW: 15.8 % — ABNORMAL HIGH (ref 11.5–15.5)
WBC: 7.1 10*3/uL (ref 4.0–10.5)
nRBC: 0 % (ref 0.0–0.2)

## 2019-11-04 LAB — URINALYSIS, COMPLETE (UACMP) WITH MICROSCOPIC
Bacteria, UA: NONE SEEN
Bilirubin Urine: NEGATIVE
Glucose, UA: NEGATIVE mg/dL
Hgb urine dipstick: NEGATIVE
Ketones, ur: 20 mg/dL — AB
Leukocytes,Ua: NEGATIVE
Nitrite: NEGATIVE
Protein, ur: >=300 mg/dL — AB
Specific Gravity, Urine: 1.037 — ABNORMAL HIGH (ref 1.005–1.030)
pH: 5 (ref 5.0–8.0)

## 2019-11-04 NOTE — ED Notes (Signed)
No answer when called several times from lobby 

## 2019-11-04 NOTE — ED Triage Notes (Signed)
Describes one year history of intermittent episodes of sweating and feeling hot, fatigue.  Reports symptoms worse over past 5 days.  Also c/o thrust and oliguria.  AAOx3.  Skin warm and dry.

## 2019-11-05 ENCOUNTER — Emergency Department
Admission: EM | Admit: 2019-11-05 | Discharge: 2019-11-05 | Disposition: A | Discharge status: Home / Self Care | Payer: Self-pay | Attending: Emergency Medicine | Admitting: Emergency Medicine

## 2019-11-05 ENCOUNTER — Encounter: Payer: Self-pay | Admitting: Emergency Medicine

## 2019-11-05 ENCOUNTER — Emergency Department: Payer: Self-pay

## 2019-11-05 ENCOUNTER — Other Ambulatory Visit: Payer: Self-pay

## 2019-11-05 DIAGNOSIS — R079 Chest pain, unspecified: Secondary | ICD-10-CM | POA: Insufficient documentation

## 2019-11-05 DIAGNOSIS — G44209 Tension-type headache, unspecified, not intractable: Secondary | ICD-10-CM | POA: Diagnosis not present

## 2019-11-05 DIAGNOSIS — R5383 Other fatigue: Secondary | ICD-10-CM | POA: Diagnosis not present

## 2019-11-05 DIAGNOSIS — R61 Generalized hyperhidrosis: Secondary | ICD-10-CM | POA: Diagnosis not present

## 2019-11-05 DIAGNOSIS — R519 Headache, unspecified: Secondary | ICD-10-CM | POA: Diagnosis present

## 2019-11-05 DIAGNOSIS — E860 Dehydration: Secondary | ICD-10-CM | POA: Insufficient documentation

## 2019-11-05 DIAGNOSIS — R110 Nausea: Secondary | ICD-10-CM | POA: Diagnosis not present

## 2019-11-05 DIAGNOSIS — R630 Anorexia: Secondary | ICD-10-CM | POA: Diagnosis not present

## 2019-11-05 LAB — URINALYSIS, COMPLETE (UACMP) WITH MICROSCOPIC
Bacteria, UA: NONE SEEN
Bilirubin Urine: NEGATIVE
Glucose, UA: NEGATIVE mg/dL
Hgb urine dipstick: NEGATIVE
Ketones, ur: 80 mg/dL — AB
Leukocytes,Ua: NEGATIVE
Nitrite: NEGATIVE
Protein, ur: 100 mg/dL — AB
Specific Gravity, Urine: 1.035 — ABNORMAL HIGH (ref 1.005–1.030)
Squamous Epithelial / LPF: NONE SEEN (ref 0–5)
pH: 5 (ref 5.0–8.0)

## 2019-11-05 LAB — COMPREHENSIVE METABOLIC PANEL
ALT: 24 U/L (ref 0–44)
AST: 29 U/L (ref 15–41)
Albumin: 4.5 g/dL (ref 3.5–5.0)
Alkaline Phosphatase: 52 U/L (ref 38–126)
Anion gap: 14 (ref 5–15)
BUN: 27 mg/dL — ABNORMAL HIGH (ref 6–20)
CO2: 23 mmol/L (ref 22–32)
Calcium: 8.8 mg/dL — ABNORMAL LOW (ref 8.9–10.3)
Chloride: 101 mmol/L (ref 98–111)
Creatinine, Ser: 0.92 mg/dL (ref 0.61–1.24)
GFR calc Af Amer: >60 mL/min (ref >60)
GFR calc non Af Amer: >60 mL/min (ref >60)
Glucose, Bld: 89 mg/dL (ref 70–99)
Potassium: 3.1 mmol/L — ABNORMAL LOW (ref 3.5–5.1)
Sodium: 138 mmol/L (ref 135–145)
Total Bilirubin: 0.7 mg/dL (ref 0.3–1.2)
Total Protein: 7.6 g/dL (ref 6.5–8.1)

## 2019-11-05 LAB — CBC WITH DIFFERENTIAL/PLATELET
Abs Immature Granulocytes: 0.01 10*3/uL (ref 0.00–0.07)
Basophils Absolute: 0 10*3/uL (ref 0.0–0.1)
Basophils Relative: 1 %
Eosinophils Absolute: 0 10*3/uL (ref 0.0–0.5)
Eosinophils Relative: 0 %
HCT: 43.3 % (ref 39.0–52.0)
Hemoglobin: 14 g/dL (ref 13.0–17.0)
Immature Granulocytes: 0 %
Lymphocytes Relative: 26 %
Lymphs Abs: 1.4 10*3/uL (ref 0.7–4.0)
MCH: 27.3 pg (ref 26.0–34.0)
MCHC: 32.3 g/dL (ref 30.0–36.0)
MCV: 84.4 fL (ref 80.0–100.0)
Monocytes Absolute: 0.6 10*3/uL (ref 0.1–1.0)
Monocytes Relative: 11 %
Neutro Abs: 3.4 10*3/uL (ref 1.7–7.7)
Neutrophils Relative %: 62 %
Platelets: 159 10*3/uL (ref 150–400)
RBC: 5.13 MIL/uL (ref 4.22–5.81)
RDW: 15.7 % — ABNORMAL HIGH (ref 11.5–15.5)
WBC: 5.5 10*3/uL (ref 4.0–10.5)
nRBC: 0 % (ref 0.0–0.2)

## 2019-11-05 LAB — GLUCOSE, CAPILLARY: Glucose-Capillary: 84 mg/dL (ref 70–99)

## 2019-11-05 MED ORDER — ALUM & MAG HYDROXIDE-SIMETH 200-200-20 MG/5ML PO SUSP
30.0000 mL | Freq: Once | ORAL | Status: AC
  Administered 2019-11-05: 30 mL via ORAL
  Filled 2019-11-05: qty 30

## 2019-11-05 MED ORDER — METOCLOPRAMIDE HCL 5 MG/ML IJ SOLN
10.0000 mg | Freq: Once | INTRAMUSCULAR | Status: AC
  Administered 2019-11-05: 10 mg via INTRAVENOUS
  Filled 2019-11-05: qty 2

## 2019-11-05 MED ORDER — LIDOCAINE VISCOUS HCL 2 % MT SOLN
15.0000 mL | Freq: Once | OROMUCOSAL | Status: AC
  Administered 2019-11-05: 15 mL via ORAL
  Filled 2019-11-05: qty 15

## 2019-11-05 MED ORDER — MORPHINE SULFATE (PF) 4 MG/ML IV SOLN
4.0000 mg | Freq: Once | INTRAVENOUS | Status: AC
  Administered 2019-11-05: 4 mg via INTRAVENOUS
  Filled 2019-11-05: qty 1

## 2019-11-05 MED ORDER — ACETAMINOPHEN 500 MG PO TABS
1000.0000 mg | ORAL_TABLET | Freq: Once | ORAL | Status: AC
  Administered 2019-11-05: 1000 mg via ORAL
  Filled 2019-11-05: qty 2

## 2019-11-05 MED ORDER — LACTATED RINGERS IV BOLUS
2000.0000 mL | Freq: Once | INTRAVENOUS | Status: AC
  Administered 2019-11-05: 2000 mL via INTRAVENOUS

## 2019-11-05 MED ORDER — DIPHENHYDRAMINE HCL 50 MG/ML IJ SOLN
25.0000 mg | Freq: Once | INTRAMUSCULAR | Status: AC
  Administered 2019-11-05: 25 mg via INTRAVENOUS
  Filled 2019-11-05: qty 1

## 2019-11-05 MED ORDER — SUCRALFATE 1 G PO TABS: 1.0000 g | ORAL_TABLET | Freq: Three times a day (TID) | ORAL | 0 refills | Status: AC

## 2019-11-05 MED ORDER — BUTALBITAL-APAP-CAFFEINE 50-325-40 MG PO TABS: 1.0000 | ORAL_TABLET | Freq: Three times a day (TID) | ORAL | 0 refills | Status: AC | PRN

## 2019-11-05 MED ORDER — PANTOPRAZOLE SODIUM 40 MG PO TBEC: 40.0000 mg | DELAYED_RELEASE_TABLET | Freq: Two times a day (BID) | ORAL | 0 refills | Status: AC

## 2019-11-05 NOTE — Discharge Instructions (Signed)
As we discussed, your symptoms are a combination of dehydration as well as possible stomach ulcer.  For your dehydration, DRINK AT LEAST 6-8 glasses of water a day. Keep these near you and keep track of how much you are drinking to make sure you stay hydrated.  For your possible ulcer,  TAKE THE PROTONIX TWICE A DAY instead of your Omeprazole TAKE THE CARAFATE as prescribed STOP TAKING any Advil, Aspirin, Goody's Powder, or other NSAID medications

## 2019-11-05 NOTE — ED Triage Notes (Signed)
Pt presents to ED via POV, A&O x4 with steady gait. Pt also c/o HA at this time. Pt states fatigue and hot flashes x 1 year with increasing intensity over the last few months. Pt states HA are "probably being caused by I jammed my neck about 3 months ago". Pt state recently went to turn his head and felt a pop, since then has been having intermittent HA.

## 2019-11-05 NOTE — ED Notes (Signed)
Off unit to ct 

## 2019-11-05 NOTE — ED Notes (Signed)
Patient with multiple c/os reports injuring his head 1 month ago, today presents with headache that has not been relieved with BC powder, has not tried tylenol or motrin. Also states increased urination, increased thirst, palpitations. CBG 84, Iv hep lock placed. Md at bedside to consult, awaiting further plan of care.

## 2019-11-05 NOTE — ED Provider Notes (Signed)
[facility] Emergency Department Provider Note  ____________________________________________   First MD Initiated Contact with Patient 11/05/19 1232     (approximate)  I have reviewed the triage vital signs and the nursing notes.   HISTORY  Chief Complaint Fatigue and Headache    HPI Lawrence Mercer is a 49 y.o. male  Here with multiple complaints.  Pt's primary complaint is generalized headache with is aching, throbbing, and constant. It began gradually and has been fairly constant for weeks to months. He states he hit his head 4 months ago and felt his neck "seizure up," and since then he's been having headaches. He's been taking a large amount of aspirin for this. He reports that his HA is fairly constant. No associated numbness or weakness. No fever, chills, or neck stiffness.   He also c/o loss of appetite, nausea, night sweats, and intermittent chest discomfort. This coincides with his headache. No change in bowel habits. No weight loss. He has a h/o GERD and has been taking omeprazole.        History reviewed. No pertinent past medical history.  Patient Active Problem List   Diagnosis Date Noted  . Cholecystitis 04/28/2015  . Acute cholecystitis   . Acute bacterial sinusitis 04/16/2014    Past Surgical History:  Procedure Laterality Date  . APPENDECTOMY    . CHOLECYSTECTOMY N/A 04/28/2015   Procedure: LAPAROSCOPIC CHOLECYSTECTOMY;  Surgeon: Lattie Haw, MD;  Location: [facility] ORS;  Service: General;  Laterality: N/A;  . ROTATOR CUFF REPAIR    . ROTATOR CUFF REPAIR     (both)  . TONSILLECTOMY      Prior to Admission medications   Medication Sig Start Date End Date Taking? Authorizing Provider  butalbital-acetaminophen-caffeine (FIORICET) 50-325-40 MG tablet Take 1-2 tablets by mouth every 8 (eight) hours as needed for headache (1 tab for moderate, 2 tab for severe). 11/05/19 11/04/20  Shaune Pollack, MD  oxyCODONE-acetaminophen  (ROXICET) 5-325 MG per tablet Take 1 tablet by mouth every 6 (six) hours as needed. 08/28/14   Emily Filbert, MD  pantoprazole (PROTONIX) 40 MG tablet Take 1 tablet (40 mg total) by mouth 2 (two) times daily for 14 days. 11/05/19 11/19/19  Shaune Pollack, MD  ranitidine (ZANTAC) 150 MG tablet Take 150 mg by mouth 2 (two) times daily.    [provider]  sucralfate (CARAFATE) 1 g tablet Take 1 tablet (1 g total) by mouth 4 (four) times daily -  with meals and at bedtime for 7 days. 11/05/19 11/12/19  Shaune Pollack, MD    Allergies Penicillins  Family History  Problem Relation Age of Onset  . Diabetes Mother   . Heart failure Father   . Diabetes Father     Social History Social History   Tobacco Use  . Smoking status: Never Smoker  . Smokeless tobacco: Current User    Types: Chew  Substance Use Topics  . Alcohol use: No    Comment: Social drinker  . Drug use: No    Review of Systems  Review of Systems  Constitutional: Positive for fatigue. Negative for chills and fever.  HENT: Negative for sore throat.   Respiratory: Negative for shortness of breath.   Cardiovascular: Negative for chest pain.  Gastrointestinal: Positive for nausea. Negative for abdominal pain.  Genitourinary: Negative for flank pain.  Musculoskeletal: Negative for neck pain.  Skin: Negative for rash and wound.  Allergic/Immunologic: Negative for immunocompromised state.  Neurological: Positive for weakness and headaches. Negative for  numbness.  Hematological: Does not bruise/bleed easily.  All other systems reviewed and are negative.    ____________________________________________  PHYSICAL EXAM:      VITAL SIGNS: ED Triage Vitals  Enc Vitals Group     BP 11/05/19 0850 (!) 142/87     Pulse Rate 11/05/19 0850 85     Resp 11/05/19 0850 20     Temp 11/05/19 0850 99.1 F (37.3 C)     Temp Source 11/05/19 0850 Oral     SpO2 11/05/19 0850 96 %     Weight 11/05/19 0851 (!) 294 lb 15.6 oz  (133.8 kg)     Height 11/05/19 0851 5\' 9"  (1.753 m)     Head Circumference --      Peak Flow --      Pain Score 11/05/19 0851 9     Pain Loc --      Pain Edu? --      Excl. in GC? --      Physical Exam Vitals and nursing note reviewed.  Constitutional:      General: He is not in acute distress.    Appearance: He is well-developed.  HENT:     Head: Normocephalic and atraumatic.  Eyes:     Conjunctiva/sclera: Conjunctivae normal.  Cardiovascular:     Rate and Rhythm: Normal rate and regular rhythm.     Heart sounds: Normal heart sounds. No murmur heard.  No friction rub.  Pulmonary:     Effort: Pulmonary effort is normal. No respiratory distress.     Breath sounds: Normal breath sounds. No wheezing or rales.  Abdominal:     General: There is no distension.     Palpations: Abdomen is soft.     Tenderness: There is no abdominal tenderness.  Musculoskeletal:     Cervical back: Neck supple.  Skin:    General: Skin is warm.     Capillary Refill: Capillary refill takes less than 2 seconds.  Neurological:     General: No focal deficit present.     Mental Status: He is alert and oriented to person, place, and time.     GCS: GCS eye subscore is 4. GCS verbal subscore is 5. GCS motor subscore is 6.     Cranial Nerves: Cranial nerves are intact.     Sensory: Sensation is intact.     Motor: Motor function is intact. No abnormal muscle tone.     Coordination: Coordination is intact.     Gait: Gait is intact.       ____________________________________________   LABS (all labs ordered are listed, but only abnormal results are displayed)  Labs Reviewed  CBC WITH DIFFERENTIAL/PLATELET - Abnormal; Notable for the following components:      Result Value   RDW 15.7 (*)    All other components within normal limits  COMPREHENSIVE METABOLIC PANEL - Abnormal; Notable for the following components:   Potassium 3.1 (*)    BUN 27 (*)    Calcium 8.8 (*)    All other components within  normal limits  URINALYSIS, COMPLETE (UACMP) WITH MICROSCOPIC - Abnormal; Notable for the following components:   Color, Urine AMBER (*)    APPearance HAZY (*)    Specific Gravity, Urine 1.035 (*)    Ketones, ur 80 (*)    Protein, ur 100 (*)    All other components within normal limits  GLUCOSE, CAPILLARY  H. PYLORI ANTIBODY, IGG    ____________________________________________  ________________________________________  RADIOLOGY All imaging, including plain  films, CT scans, and ultrasounds, independently reviewed by me, and interpretations confirmed via formal radiology reads.  ED MD interpretation:   CT Head: NAICA CT C Spine: Degen changes, no acute abnormality AAS: Neg abdominal radiographs   Official radiology report(s): CT Head Wo Contrast  Result Date: 11/05/2019 CLINICAL DATA:  Headache EXAM: CT HEAD WITHOUT CONTRAST CT CERVICAL SPINE WITHOUT CONTRAST TECHNIQUE: Multidetector CT imaging of the head and cervical spine was performed following the standard protocol without intravenous contrast. Multiplanar CT image reconstructions of the cervical spine were also generated. COMPARISON:  None FINDINGS: CT HEAD FINDINGS Brain: No evidence of acute infarction, hemorrhage, hydrocephalus, extra-axial collection or mass lesion/mass effect. Vascular: Negative for hyperdense vessel Skull: Negative Sinuses/Orbits: Mucosal edema in the maxillary sinus bilaterally. Retention cyst in the base of the right maxillary sinus. Negative orbit Other: None CT CERVICAL SPINE FINDINGS Alignment: Normal Skull base and vertebrae: Negative for fracture Soft tissues and spinal canal: Negative Disc levels: Mild disc space narrowing C5-6 without spurring. Facet degeneration on the left at C3-4 and C6-7. Mild left foraminal narrowing at C3-4 due to spurring. Upper chest: Lung apices clear bilaterally. Other: None IMPRESSION: Negative CT head Negative for cervical spine fracture. Cervical facet degeneration on the  left at C3-4 with associated foraminal stenosis. Mild facet degeneration on the left at C6-7. Electronically Signed   By: Marlan Palau M.D.   On: 11/05/2019 13:35   CT Cervical Spine Wo Contrast  Result Date: 11/05/2019 CLINICAL DATA:  Headache EXAM: CT HEAD WITHOUT CONTRAST CT CERVICAL SPINE WITHOUT CONTRAST TECHNIQUE: Multidetector CT imaging of the head and cervical spine was performed following the standard protocol without intravenous contrast. Multiplanar CT image reconstructions of the cervical spine were also generated. COMPARISON:  None FINDINGS: CT HEAD FINDINGS Brain: No evidence of acute infarction, hemorrhage, hydrocephalus, extra-axial collection or mass lesion/mass effect. Vascular: Negative for hyperdense vessel Skull: Negative Sinuses/Orbits: Mucosal edema in the maxillary sinus bilaterally. Retention cyst in the base of the right maxillary sinus. Negative orbit Other: None CT CERVICAL SPINE FINDINGS Alignment: Normal Skull base and vertebrae: Negative for fracture Soft tissues and spinal canal: Negative Disc levels: Mild disc space narrowing C5-6 without spurring. Facet degeneration on the left at C3-4 and C6-7. Mild left foraminal narrowing at C3-4 due to spurring. Upper chest: Lung apices clear bilaterally. Other: None IMPRESSION: Negative CT head Negative for cervical spine fracture. Cervical facet degeneration on the left at C3-4 with associated foraminal stenosis. Mild facet degeneration on the left at C6-7. Electronically Signed   By: Marlan Palau M.D.   On: 11/05/2019 13:35   DG Abdomen Acute W/Chest  Result Date: 11/05/2019 CLINICAL DATA:  Epigastric abdominal pain. EXAM: DG ABDOMEN ACUTE W/ 1V CHEST COMPARISON:  Aug 27, 2014. FINDINGS: There is no evidence of dilated bowel loops or free intraperitoneal air. No radiopaque calculi or other significant radiographic abnormality is seen. Heart size and mediastinal contours are within normal limits. Both lungs are clear. IMPRESSION:  Negative abdominal radiographs.  No acute cardiopulmonary disease. Electronically Signed   By: Lupita Raider M.D.   On: 11/05/2019 13:57    ____________________________________________  PROCEDURES   Procedure(s) performed (including Critical Care):  Procedures  ____________________________________________  INITIAL IMPRESSION / MDM / ASSESSMENT AND PLAN / ED COURSE  As part of my medical decision making, I reviewed the following data within the electronic MEDICAL RECORD NUMBER Nursing notes reviewed and incorporated, Old chart reviewed, Notes from prior ED visits, and [place] Controlled Substance Database       *  Dhruv Christina was evaluated in Emergency Department on 11/05/2019 for the symptoms described in the history of present illness. He was evaluated in the context of the global COVID-19 pandemic, which necessitated consideration that the patient might be at risk for infection with the SARS-CoV-2 virus that causes COVID-19. Institutional protocols and algorithms that pertain to the evaluation of patients at risk for COVID-19 are in a state of rapid change based on information released by regulatory bodies including the CDC and federal and state organizations. These policies and algorithms were followed during the patient's care in the ED.  Some ED evaluations and interventions may be delayed as a result of limited staffing during the pandemic.*     Medical Decision Making:  49 yo M with PMHx as above here with multiple complaints. Primary complaint is near daily headache, which in description sounds tension-type vs analgesic rebound. He also reports he hit his head prior to the HA onset so post-concussive ha is a consideration. No focal neurological deficits. CT Head shows NAICA. No signs of significant cervical injury. Pain is daily for months and do not suspect SAH, meningitis, encephalitis.   I suspect that 2/2 his headache, he has been taking high dose NSAIDs/ASA and has now possibly  developed gastritis. In addition to his HA, he reports night sweats, loss of appetite, indigestion. He has elevated BUn and ketonuria 2/2 his loss of appetite and poor PO intake. No signs of DKA. Abdomen is soft and XR shows no large lesions, obstruction, free air or obstruction. Hgb is normal.   Will have him stop all NSAID/ASA and give brief course of fioricet for his HA, and tx for NSAID-related gastritis with protonix and carafate. Will refer to GI for his weight loss, loss of appetite, abd pain. Return precautions given.  ____________________________________________  FINAL CLINICAL IMPRESSION(S) / ED DIAGNOSES  Final diagnoses:  Acute non intractable tension-type headache  Loss of appetite  Dehydration     MEDICATIONS GIVEN DURING THIS VISIT:  Medications  metoCLOPramide (REGLAN) injection 10 mg (10 mg Intravenous Given 11/05/19 1353)  diphenhydrAMINE (BENADRYL) injection 25 mg (25 mg Intravenous Given 11/05/19 1353)  alum & mag hydroxide-simeth (MAALOX/MYLANTA) 200-200-20 MG/5ML suspension 30 mL (30 mLs Oral Given 11/05/19 1353)    And  lidocaine (XYLOCAINE) 2 % viscous mouth solution 15 mL (15 mLs Oral Given 11/05/19 1354)  lactated ringers bolus 2,000 mL (0 mLs Intravenous Stopped 11/05/19 1600)  acetaminophen (TYLENOL) tablet 1,000 mg (1,000 mg Oral Given 11/05/19 1600)  morphine 4 MG/ML injection 4 mg (4 mg Intravenous Given 11/05/19 1600)     ED Discharge Orders         Ordered    butalbital-acetaminophen-caffeine (FIORICET) 50-325-40 MG tablet  Every 8 hours PRN     Discontinue  Reprint     11/05/19 1517    pantoprazole (PROTONIX) 40 MG tablet  2 times daily     Discontinue  Reprint     11/05/19 1517    sucralfate (CARAFATE) 1 g tablet  3 times daily with meals & bedtime     Discontinue  Reprint     11/05/19 1517           Note:  This document was prepared using Dragon voice recognition software and may include unintentional dictation errors.   Shaune Pollack,  MD 11/05/19 2008

## 2020-12-20 ENCOUNTER — Ambulatory Visit: Payer: Self-pay

## 2021-03-27 ENCOUNTER — Encounter: Payer: Self-pay | Admitting: Emergency Medicine

## 2021-03-27 ENCOUNTER — Ambulatory Visit
Admission: EM | Admit: 2021-03-27 | Discharge: 2021-03-27 | Disposition: A | Discharge status: Home / Self Care | Payer: BC Managed Care – PPO | Attending: Family Medicine | Admitting: Family Medicine

## 2021-03-27 ENCOUNTER — Other Ambulatory Visit: Payer: Self-pay

## 2021-03-27 ENCOUNTER — Ambulatory Visit (INDEPENDENT_AMBULATORY_CARE_PROVIDER_SITE_OTHER): Payer: BC Managed Care – PPO

## 2021-03-27 DIAGNOSIS — R059 Cough, unspecified: Secondary | ICD-10-CM

## 2021-03-27 DIAGNOSIS — J209 Acute bronchitis, unspecified: Secondary | ICD-10-CM

## 2021-03-27 DIAGNOSIS — R0602 Shortness of breath: Secondary | ICD-10-CM | POA: Diagnosis not present

## 2021-03-27 DIAGNOSIS — R079 Chest pain, unspecified: Secondary | ICD-10-CM

## 2021-03-27 MED ORDER — PROMETHAZINE-DM 6.25-15 MG/5ML PO SYRP: 5.0000 mL | ORAL_SOLUTION | Freq: Four times a day (QID) | ORAL | 0 refills | Status: AC | PRN

## 2021-03-27 MED ORDER — PREDNISONE 20 MG PO TABS: 40.0000 mg | ORAL_TABLET | Freq: Every day | ORAL | 0 refills | Status: AC

## 2021-03-27 NOTE — ED Provider Notes (Signed)
Roderic Palau    CSN: [id number] Arrival date & time: 03/27/21  0856      History   Chief Complaint Chief Complaint  Patient presents with   Cough   Chest Pain    RIB    HPI Lawrence Mercer is a 50 y.o. male.   HPI Patient presents with a persistent cough and right rib pain following a recent URI >7 days. He is afebrile. Current smoker. No history of asthma or chronic bronchitis. He reports after a coughing spell yesterday feeling the sensation of pop in right upper rib. He began taking left Amoxicillin 4 days which helped symptoms although he continues to have chest congestion and recurrent cough. No fever. History reviewed. No pertinent past medical history.  Patient Active Problem List   Diagnosis Date Noted   Cholecystitis 04/28/2015   Acute cholecystitis    Acute bacterial sinusitis 04/16/2014    Past Surgical History:  Procedure Laterality Date   APPENDECTOMY     CHOLECYSTECTOMY N/A 04/28/2015   Procedure: LAPAROSCOPIC CHOLECYSTECTOMY;  Surgeon: Florene Glen, MD;  Location: [facility] ORS;  Service: General;  Laterality: N/A;   [place]     (both)   TONSILLECTOMY         Home Medications    Prior to Admission medications   Medication Sig Start Date End Date Taking? Authorizing Provider  predniSONE (DELTASONE) 20 MG tablet Take 2 tablets (40 mg total) by mouth daily with breakfast. 03/27/21  Yes Scot Jun, FNP  promethazine-dextromethorphan (PROMETHAZINE-DM) 6.25-15 MG/5ML syrup Take 5 mLs by mouth 4 (four) times daily as needed for cough. 03/27/21  Yes Scot Jun, FNP  oxyCODONE-acetaminophen (ROXICET) 5-325 MG per tablet Take 1 tablet by mouth every 6 (six) hours as needed. 08/28/14   Earleen Newport, MD  pantoprazole (PROTONIX) 40 MG tablet Take 1 tablet (40 mg total) by mouth 2 (two) times daily for 14 days. 11/05/19 11/19/19  Duffy Bruce, MD  ranitidine (ZANTAC) 150 MG tablet Take 150 mg  by mouth 2 (two) times daily.    [provider]  sucralfate (CARAFATE) 1 g tablet Take 1 tablet (1 g total) by mouth 4 (four) times daily -  with meals and at bedtime for 7 days. 11/05/19 11/12/19  Duffy Bruce, MD    Family History Family History  Problem Relation Age of Onset   Diabetes Mother    Heart failure Father    Diabetes Father     Social History Social History   Tobacco Use   Smoking status: Never   Smokeless tobacco: Current    Types: Chew  Substance Use Topics   Alcohol use: No    Comment: Social drinker   Drug use: No     Allergies   Penicillins   Review of Systems Review of Systems Pertinent negatives listed in HPI   Physical Exam Triage Vital Signs ED Triage Vitals  Enc Vitals Group     BP 03/27/21 0939 (!) 148/99     Pulse Rate 03/27/21 0939 84     Resp 03/27/21 0939 18     Temp 03/27/21 0939 98 F (36.7 C)     Temp src --      SpO2 03/27/21 0939 98 %     Weight --      Height --      Head Circumference --      Peak Flow --      Pain  Score 03/27/21 0944 7     Pain Loc --      Pain Edu? --      Excl. in GC? --    No data found.  Updated Vital Signs BP (!) 148/99    Pulse 84    Temp 98 F (36.7 C)    Resp 18    SpO2 98%   Visual Acuity Right Eye Distance:   Left Eye Distance:   Bilateral Distance:    Right Eye Near:   Left Eye Near:    Bilateral Near:     Physical Exam Constitutional:      Appearance: He is obese. He is ill-appearing.  HENT:     Head: Normocephalic and atraumatic.  Cardiovascular:     Rate and Rhythm: Normal rate and regular rhythm.  Pulmonary:     Breath sounds: Rhonchi present.     Comments: Tenderness reproducible with palpation right rib distal right axilla. No ecchymosis or visible deformity present  Chest:     Chest wall: Tenderness present.  Skin:    General: Skin is warm and dry.  Neurological:     General: No focal deficit present.     Mental Status: He is alert.  Psychiatric:         Mood and Affect: Mood normal.        Behavior: Behavior normal.        Thought Content: Thought content normal.     UC Treatments / Results  Labs (all labs ordered are listed, but only abnormal results are displayed) Labs Reviewed - No data to display  EKG   Radiology DG Ribs Unilateral W/Chest Right  Result Date: 03/27/2021 CLINICAL DATA:  Right right pain, cough, shortness of breath EXAM: RIGHT RIBS AND CHEST - 3+ VIEW COMPARISON:  08/23/2014 FINDINGS: No fracture or other bone lesions are seen involving the ribs. There is no evidence of pneumothorax or pleural effusion. Both lungs are clear. Heart size and mediastinal contours are within normal limits. IMPRESSION: Negative. Electronically Signed   By: Corlis Leak M.D.   On: 03/27/2021 10:22    Procedures Procedures (including critical care time)  Medications Ordered in UC Medications - No data to display  Initial Impression / Assessment and Plan / UC Course  I have reviewed the triage vital signs and the nursing notes.  Pertinent labs & imaging results that were available during my care of the patient were reviewed by me and considered in my medical decision making (see chart for details).    Acute bronchitis, treatment with prednisone 40 mg once daily for total 5 days.  Promethazine DM as needed for cough.  Continue to hydrate well with fluids.  Return precautions given if symptoms worsen or do not improve.  Checks x-ray is negative for pneumonia and shows no fracture involving the ribs.  Suspect rib pain is likely related to inflammation from coughing.  Follow-up as needed. Final Clinical Impressions(s) / UC Diagnoses   Final diagnoses:  Acute bronchitis, unspecified organism     Discharge Instructions      Chest x-ray is negative for any pneumonia and no fracture involving the ribs.  Pain is likely secondary from persistent coughing, sling inflammation.  Start prednisone 40 mg once daily for 5 days.  Promethazine  DM as needed for cough.  Return as needed.     ED Prescriptions     Medication Sig Dispense Auth. Provider   predniSONE (DELTASONE) 20 MG tablet Take 2 tablets (40 mg  total) by mouth daily with breakfast. 10 tablet Scot Jun, FNP   promethazine-dextromethorphan (PROMETHAZINE-DM) 6.25-15 MG/5ML syrup Take 5 mLs by mouth 4 (four) times daily as needed for cough. 140 mL Scot Jun, FNP      PDMP not reviewed this encounter.   Scot Jun, FNP 03/27/21 1115

## 2021-03-27 NOTE — Discharge Instructions (Signed)
Chest x-ray is negative for any pneumonia and no fracture involving the ribs.  Pain is likely secondary from persistent coughing, sling inflammation.  Start prednisone 40 mg once daily for 5 days.  Promethazine DM as needed for cough.  Return as needed.

## 2021-03-27 NOTE — ED Triage Notes (Signed)
Pt here with cough following a URI last week that has caused pain in right lower rib cage that is tender to the touch.

## 2022-04-25 IMAGING — CT CT HEAD W/O CM
5 of 7 series · 17 of 47 positions shown, 18 images · non-contrast
Comparison: None

CLINICAL DATA: Headache

EXAM:
CT HEAD WITHOUT CONTRAST
CT CERVICAL SPINE WITHOUT CONTRAST
TECHNIQUE: Multidetector CT imaging of the head and cervical spine was
performed following the standard protocol without intravenous
contrast. Multiplanar CT image reconstructions of the cervical spine
were also generated.

[Series 2: head wo · axial · 0.47mm/px · z∈[-144,-89]mm · 2 of 34 slices shown, 3 images]
[im 12/34  brain]
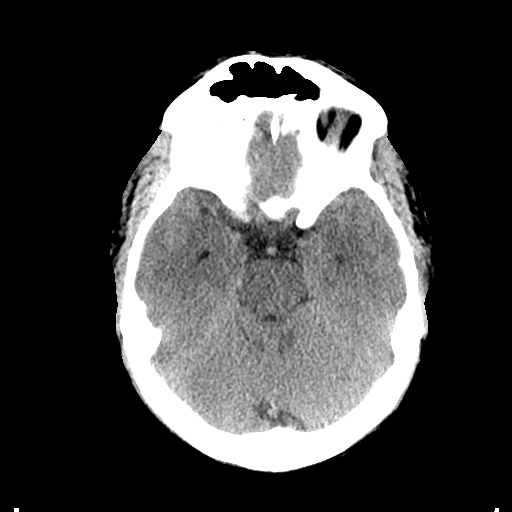
[im 12/34  bone]
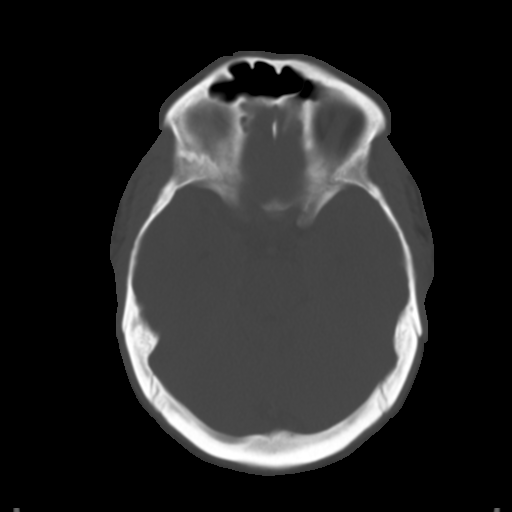
[im 23/34  brain]
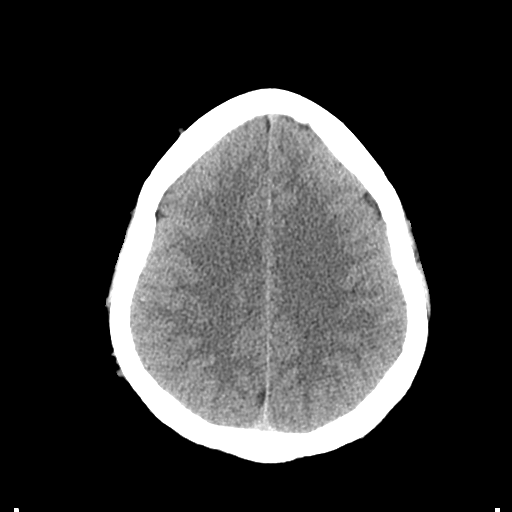

[Series 4: coronal soft tissue · coronal · 0.32mm/px · 2 of 72 slices shown]
[im 14/72  brain]
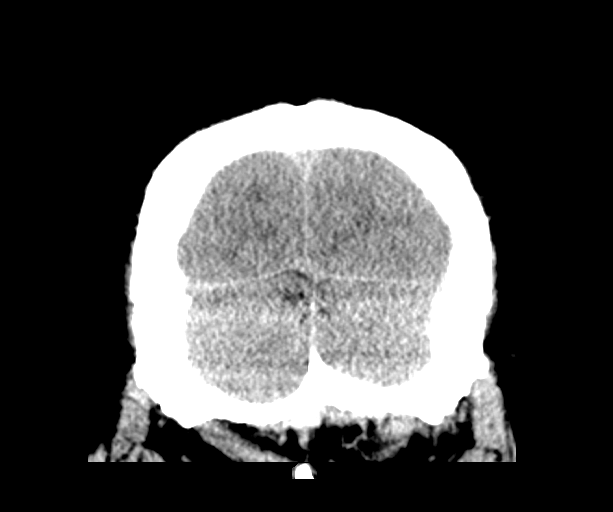
[im 27/72  brain]
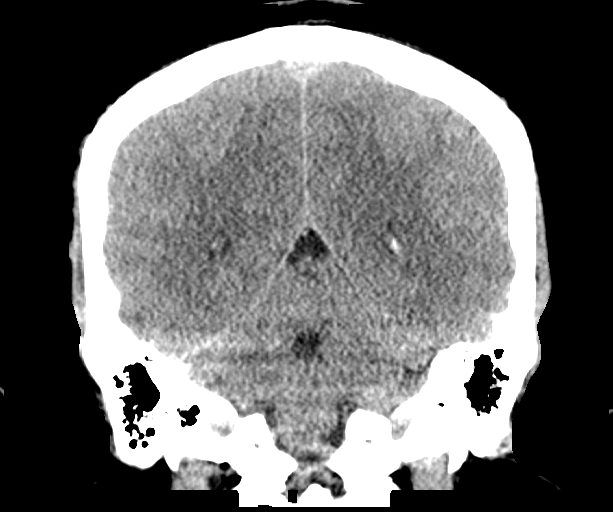

[Series 5: sagittal soft tissue · sagittal · 0.32mm/px · 1 of 65 slices shown]
[im 33/65  brain]
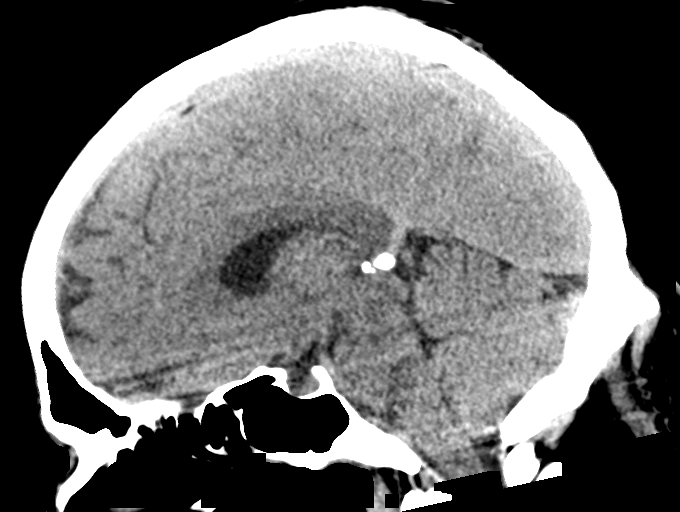

[Series 7: c spine soft · axial · 0.29mm/px · z∈[-344,-278]mm · 4 of 92 slices shown]
[im 9/92  brain]
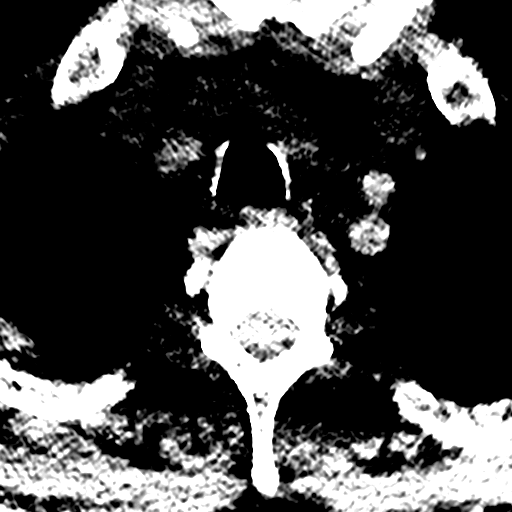
[im 17/92  brain]
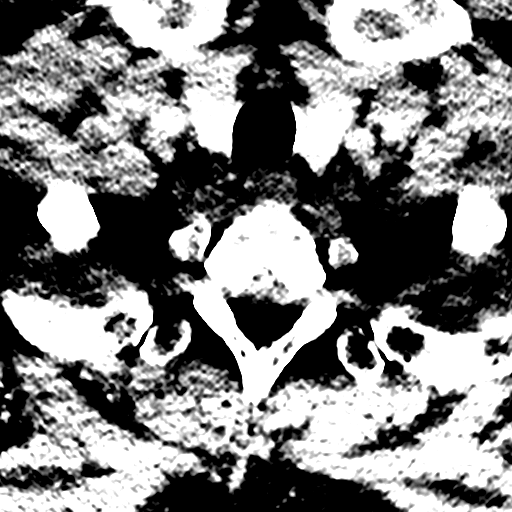
[im 34/92  brain]
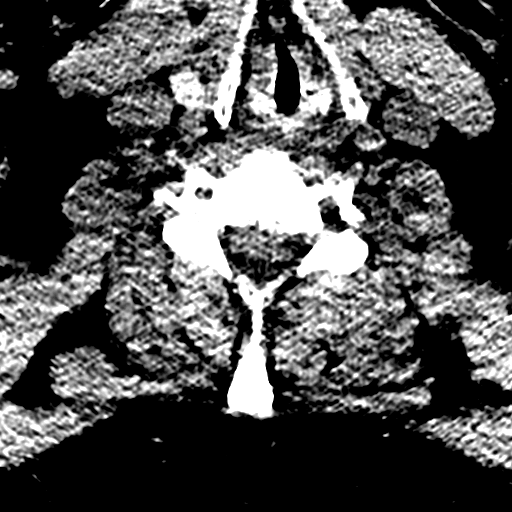
[im 42/92  brain]
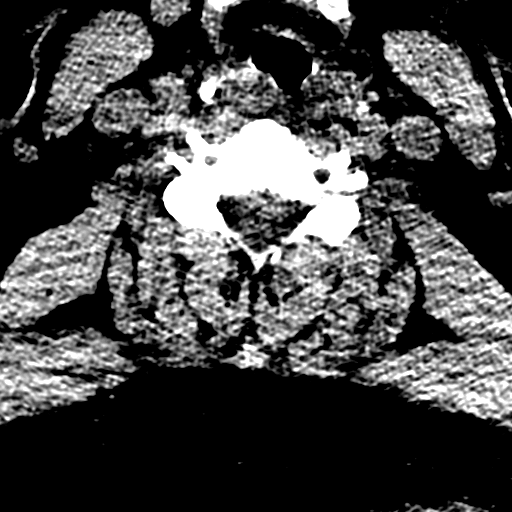

[Series 12: orthogonal bone · axial · 0.24mm/px · z∈[-366,-201]mm · 8 of 105 slices shown]
[im 9/105  bone]
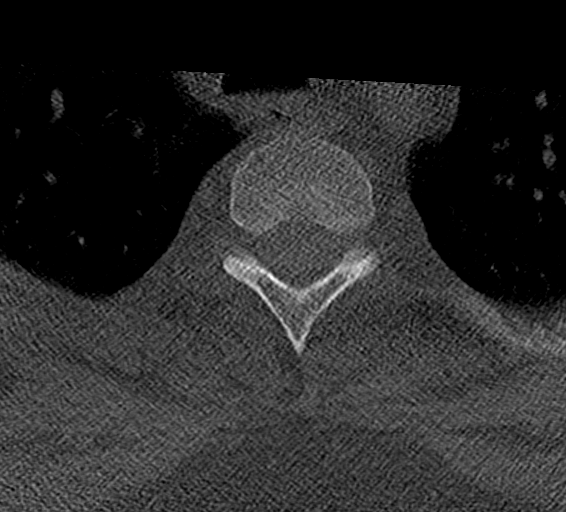
[im 25/105  bone]
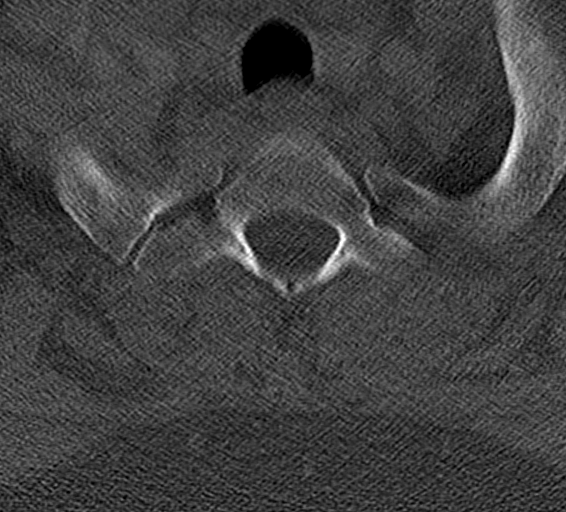
[im 33/105  bone]
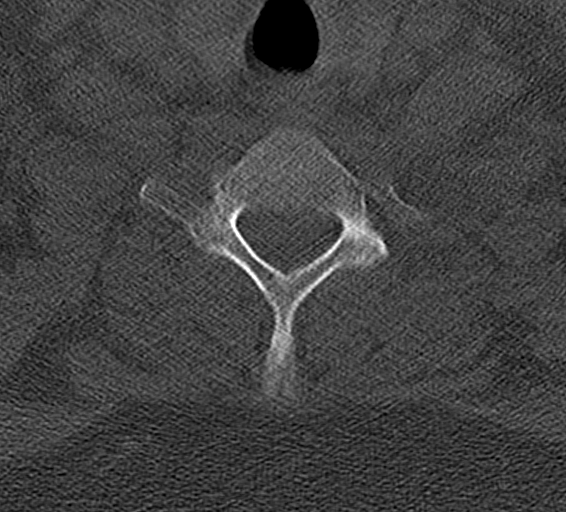
[im 49/105  bone]
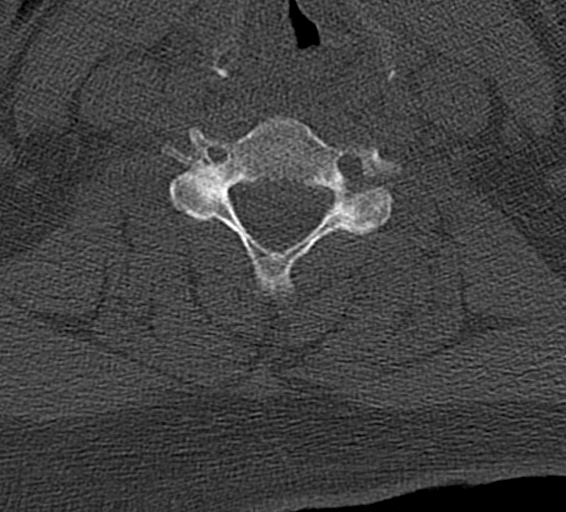
[im 57/105  bone]
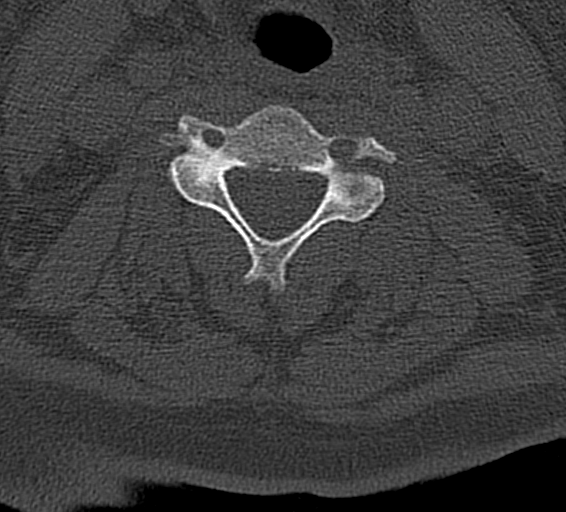
[im 73/105  bone]
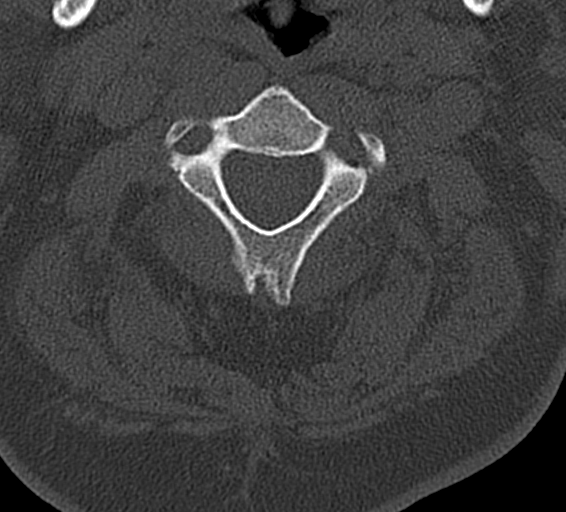
[im 81/105  bone]
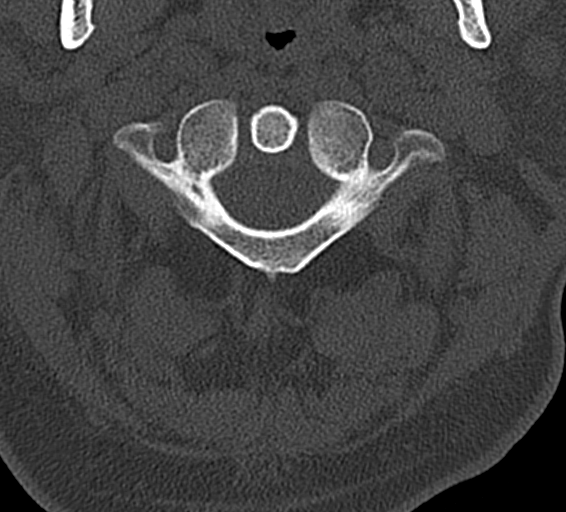
[im 97/105  bone]
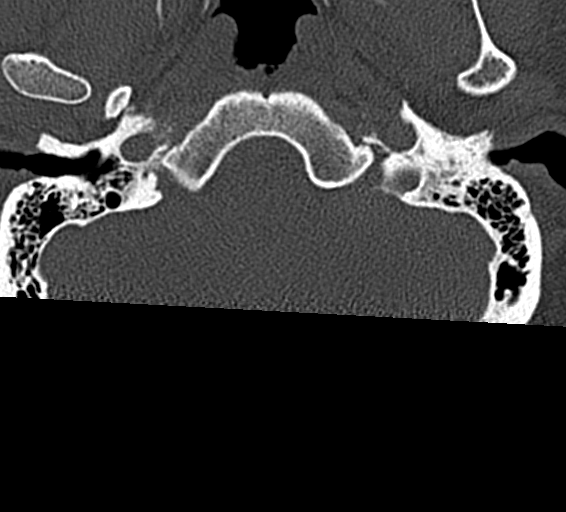

[17 of 47 positions shown; findings below may reference images not displayed]

FINDINGS: CT HEAD FINDINGS

Brain: No evidence of acute infarction, hemorrhage, hydrocephalus,
extra-axial collection or mass lesion/mass effect.

Vascular: Negative for hyperdense vessel

Skull: Negative

Sinuses/Orbits: Mucosal edema in the maxillary sinus bilaterally.
Retention cyst in the base of the right maxillary sinus. Negative
orbit

Other: None

CT CERVICAL SPINE FINDINGS

Alignment: Normal

Skull base and vertebrae: Negative for fracture

Soft tissues and spinal canal: Negative

Disc levels: Mild disc space narrowing C5-6 without spurring. Facet
degeneration on the left at C3-4 and C6-7. Mild left foraminal
narrowing at C3-4 due to spurring.

Upper chest: Lung apices clear bilaterally.

Other: None
IMPRESSION: Negative CT head

Negative for cervical spine fracture. Cervical facet degeneration on
the left at C3-4 with associated foraminal stenosis. Mild facet
degeneration on the left at C6-7.

## 2023-09-15 IMAGING — DX DG RIBS W/ CHEST 3+V*R*
3 series · 3 of 3 positions shown · non-contrast
Comparison: 08/23/2014

CLINICAL DATA: Right right pain, cough, shortness of breath

EXAM:
RIGHT RIBS AND CHEST - 3+ VIEW

[chest pa]
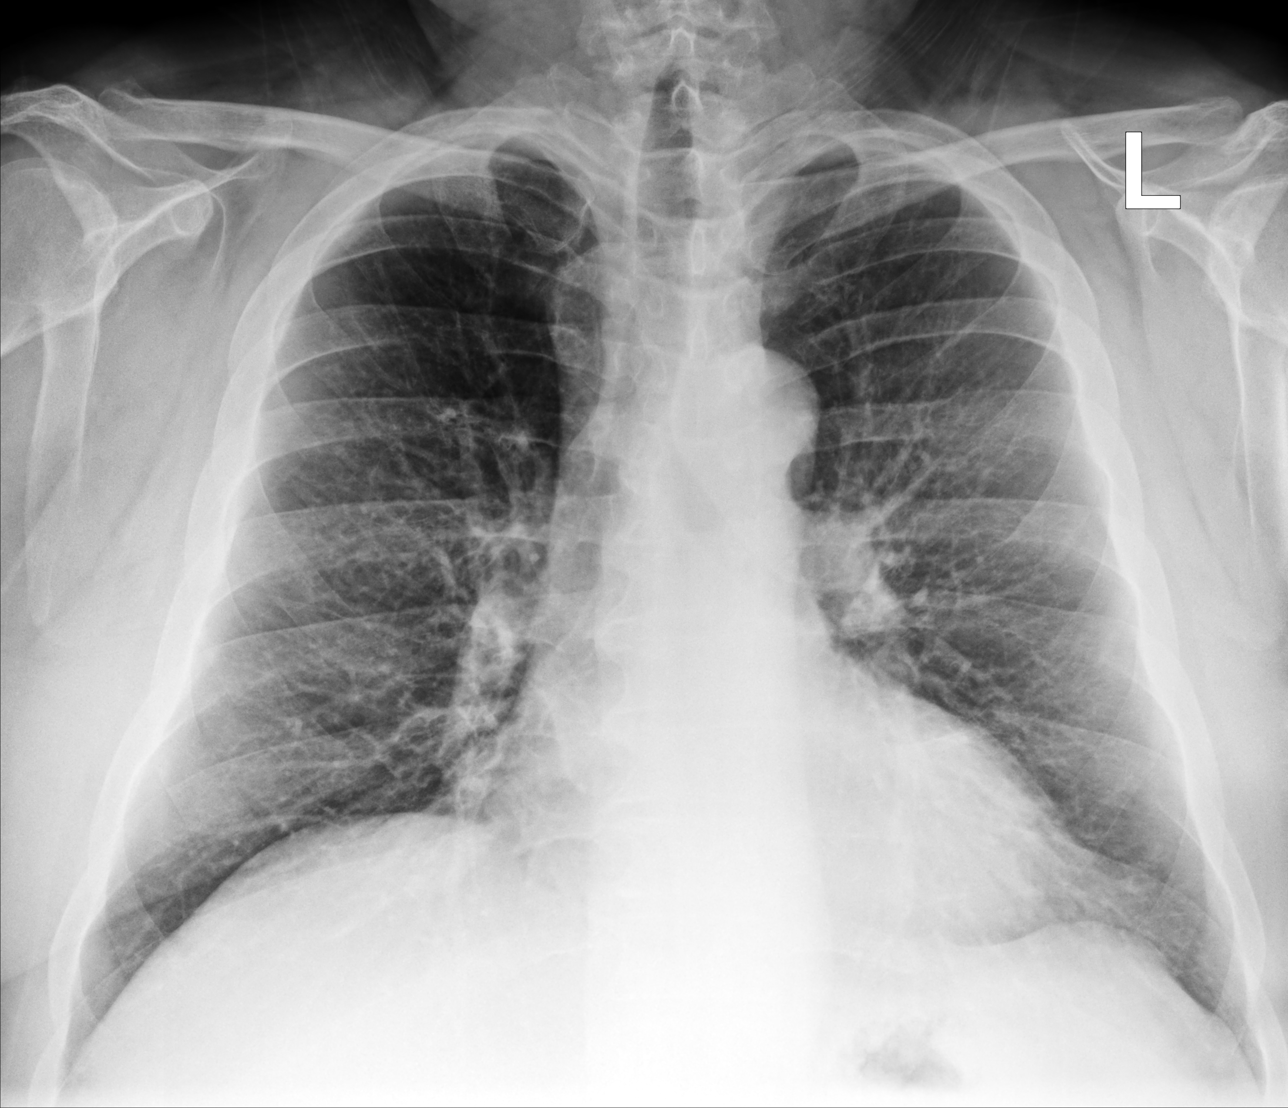

[hemithorax (ribs) ap]
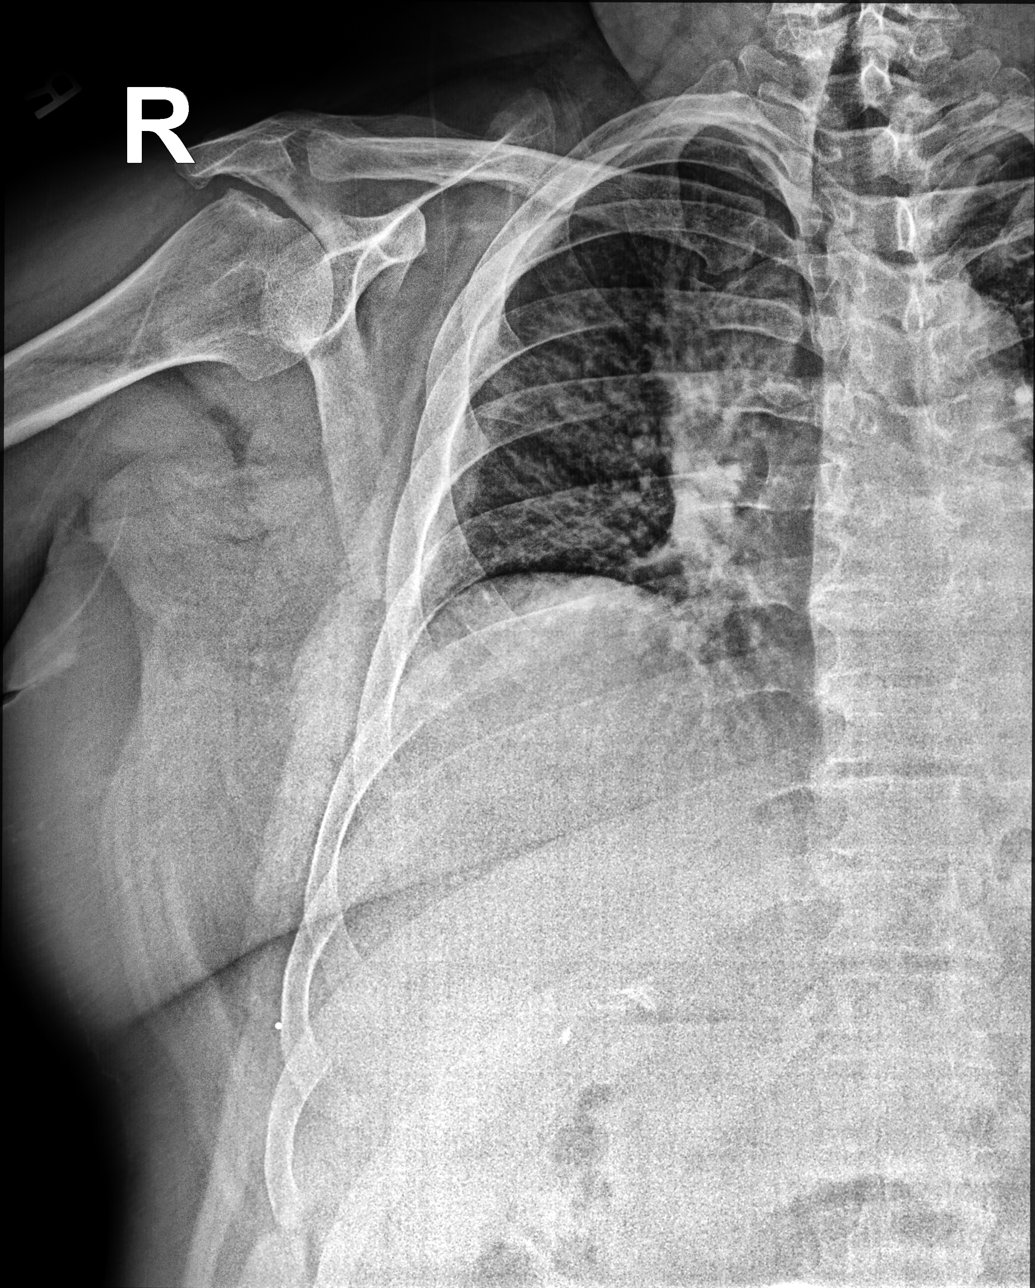

[hemithorax (ribs) mlo]
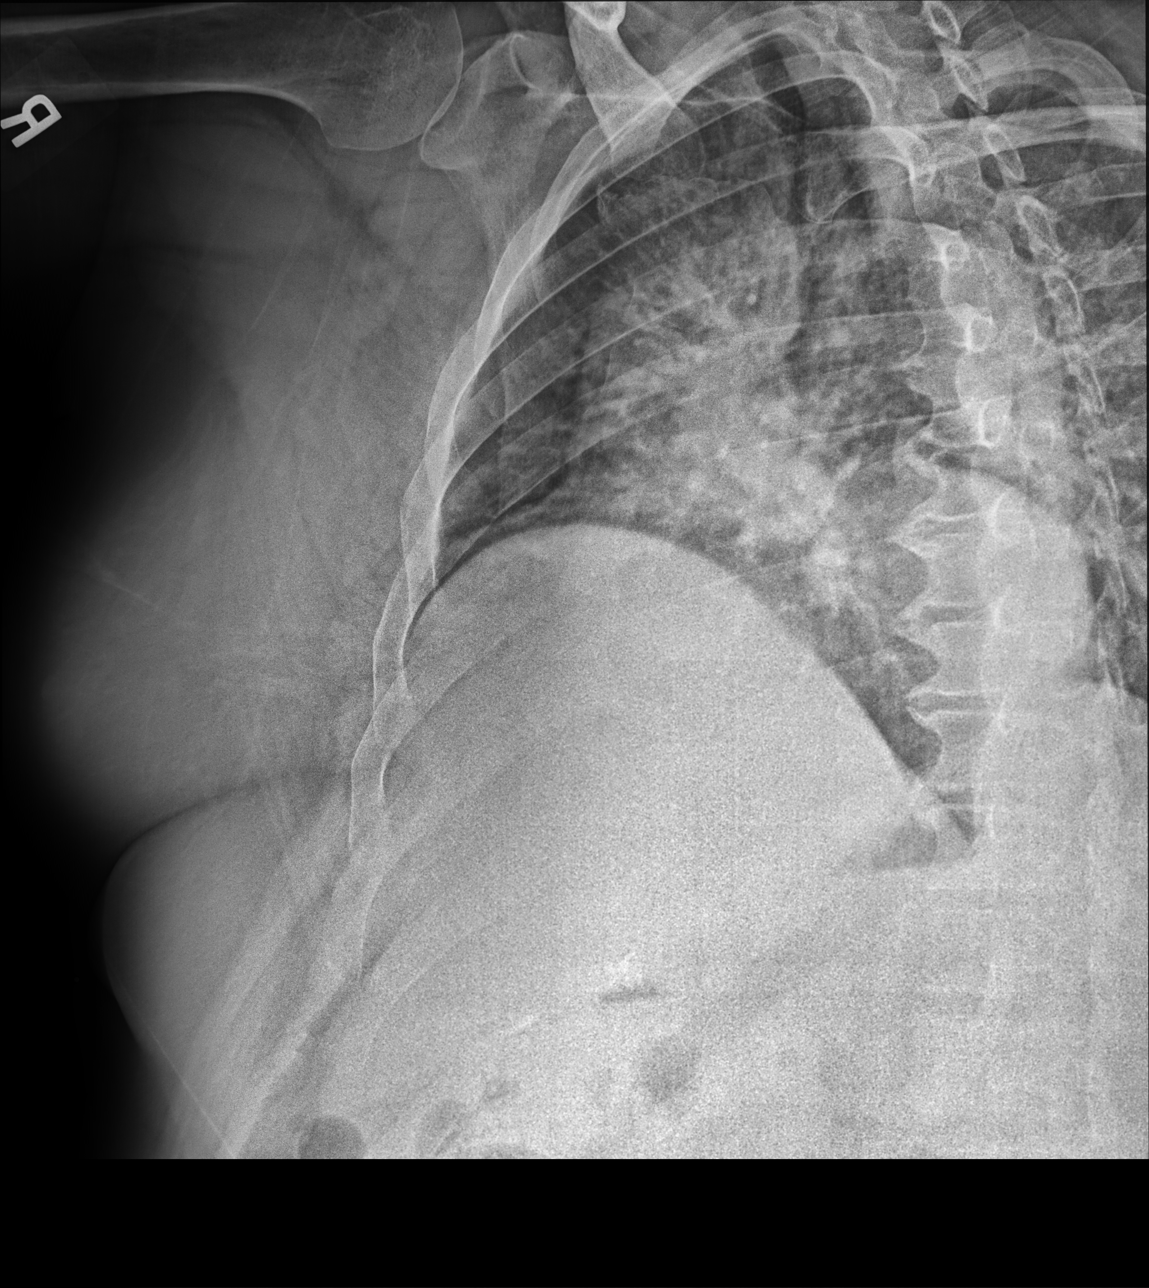

[3 of 3 positions shown; findings below may reference images not displayed]

FINDINGS: No fracture or other bone lesions are seen involving the ribs. There
is no evidence of pneumothorax or pleural effusion. Both lungs are
clear. Heart size and mediastinal contours are within normal limits.
IMPRESSION: Negative.
